# Patient Record
Sex: Male | Born: 1945 | Race: White | Hispanic: No | Smoking: Former smoker
Health system: Southern US, Community
[De-identification: ages and names within clinical notes are randomized; demographics above are authoritative.]

## PROBLEM LIST (undated history)

## (undated) DIAGNOSIS — K219 Gastro-esophageal reflux disease without esophagitis: Secondary | ICD-10-CM

## (undated) DIAGNOSIS — M5136 Other intervertebral disc degeneration, lumbar region: Secondary | ICD-10-CM

## (undated) DIAGNOSIS — F32A Depression, unspecified: Secondary | ICD-10-CM

## (undated) DIAGNOSIS — C801 Malignant (primary) neoplasm, unspecified: Secondary | ICD-10-CM

## (undated) DIAGNOSIS — F09 Unspecified mental disorder due to known physiological condition: Secondary | ICD-10-CM

## (undated) DIAGNOSIS — M51369 Other intervertebral disc degeneration, lumbar region without mention of lumbar back pain or lower extremity pain: Secondary | ICD-10-CM

## (undated) DIAGNOSIS — G809 Cerebral palsy, unspecified: Secondary | ICD-10-CM

## (undated) DIAGNOSIS — M549 Dorsalgia, unspecified: Secondary | ICD-10-CM

## (undated) DIAGNOSIS — F329 Major depressive disorder, single episode, unspecified: Secondary | ICD-10-CM

## (undated) HISTORY — PX: TOTAL HIP ARTHROPLASTY: SHX124

---

## 2015-08-12 ENCOUNTER — Emergency Department
Admission: EM | Admit: 2015-08-12 | Discharge: 2015-08-12 | Disposition: A | Discharge status: Home / Self Care | Payer: Medicare Other | Attending: Emergency Medicine | Admitting: Emergency Medicine

## 2015-08-12 ENCOUNTER — Emergency Department: Payer: Medicare Other

## 2015-08-12 DIAGNOSIS — Z87891 Personal history of nicotine dependence: Secondary | ICD-10-CM | POA: Insufficient documentation

## 2015-08-12 DIAGNOSIS — F329 Major depressive disorder, single episode, unspecified: Secondary | ICD-10-CM | POA: Diagnosis not present

## 2015-08-12 DIAGNOSIS — G8929 Other chronic pain: Secondary | ICD-10-CM | POA: Insufficient documentation

## 2015-08-12 DIAGNOSIS — G809 Cerebral palsy, unspecified: Secondary | ICD-10-CM | POA: Diagnosis not present

## 2015-08-12 DIAGNOSIS — M549 Dorsalgia, unspecified: Secondary | ICD-10-CM | POA: Insufficient documentation

## 2015-08-12 HISTORY — DX: Dorsalgia, unspecified: M54.9

## 2015-08-12 HISTORY — DX: Depression, unspecified: F32.A

## 2015-08-12 HISTORY — DX: Cerebral palsy, unspecified: G80.9

## 2015-08-12 HISTORY — DX: Major depressive disorder, single episode, unspecified: F32.9

## 2015-08-12 LAB — URINALYSIS COMPLETE WITH MICROSCOPIC (ARMC ONLY)
Bacteria, UA: NONE SEEN
Bilirubin Urine: NEGATIVE
Glucose, UA: NEGATIVE mg/dL
HGB URINE DIPSTICK: NEGATIVE
KETONES UR: NEGATIVE mg/dL
LEUKOCYTES UA: NEGATIVE
NITRITE: NEGATIVE
PH: 6 (ref 5.0–8.0)
PROTEIN: NEGATIVE mg/dL
SPECIFIC GRAVITY, URINE: 1.015 (ref 1.005–1.030)
SQUAMOUS EPITHELIAL / LPF: NONE SEEN

## 2015-08-12 MED ORDER — OXYCODONE-ACETAMINOPHEN 5-325 MG PO TABS: 2.0000 | ORAL_TABLET | Freq: Four times a day (QID) | ORAL | Status: DC | PRN

## 2015-08-12 NOTE — ED Notes (Signed)
Attempted to call report to facility.  Left message that pt would be returning to them and to call with any questions.

## 2015-08-12 NOTE — ED Notes (Signed)
Spoke with Judson Roch at [facility] and gave report.  Verbalized understanding.

## 2015-08-12 NOTE — ED Notes (Signed)
Pt has chronic back pain that pt is on medication for.  [facility] wanted patient evaluated for back pain tonight.  Pt states that pain is not any worse than normal.

## 2015-08-12 NOTE — ED Provider Notes (Signed)
[facility] Emergency Department Provider Note        Time seen: ----------------------------------------- 8:49 PM on 08/12/2015 -----------------------------------------    I have reviewed the triage vital signs and the nursing notes.   HISTORY  Chief Complaint Back Pain    HPI Bob [name] is a 70 y.o. male who presents to ER for back pain. Patient reportedly has history of chronic back pain that he is on pain medicine for. Assisted-living facility sent him for evaluation of his back pain tonight. Patient states the pain is not any worse than normal, denies any recent illness or other complaints this time.   No past medical history on file.  There are no active problems to display for this patient.   No past surgical history on file.  Allergies Review of patient's allergies indicates not on file.  Social History Social History  Substance Use Topics  . Smoking status: Not on file  . Smokeless tobacco: Not on file  . Alcohol Use: Not on file    Review of Systems Constitutional: Negative for fever. Cardiovascular: Negative for chest pain. Respiratory: Negative for shortness of breath. Gastrointestinal: Negative for abdominal pain, vomiting and diarrhea. Genitourinary: Negative for dysuria. Musculoskeletal: Positive for back pain Skin: Negative for rash. Neurological: Negative for headaches, focal weakness or numbness.  10-point ROS otherwise negative.  ____________________________________________   PHYSICAL EXAM:  VITAL SIGNS: ED Triage Vitals  Enc Vitals Group     BP --      Pulse --      Resp --      Temp 08/12/15 2048 99.4 F (37.4 C)     Temp Source 08/12/15 2048 Oral     SpO2 --      Weight --      Height --      Head Cir --      Peak Flow --      Pain Score --      Pain Loc --      Pain Edu? --      Excl. in [place]? --     Constitutional: Alert, No acute distress Eyes: Conjunctivae are normal.  Cardiovascular:  Normal rate, regular rhythm. No murmurs, rubs, or gallops. Respiratory: Normal respiratory effort without tachypnea nor retractions. Breath sounds are clear and equal bilaterally. No wheezes/rales/rhonchi. Gastrointestinal: Soft and nontender. Normal bowel sounds Musculoskeletal: Nontender with normal range of motion in all extremities. Neurologic:  Normal speech and language. Patient with chronic neurologic problems suggestive of cerebral palsy. Skin:  Skin is warm, dry and intact. No rash noted. ____________________________________________  ED COURSE:  Pertinent labs & imaging results that were available during my care of the patient were reviewed by me and considered in my medical decision making (see chart for details). Patient presents to the ER ambulatory complaining of chronic back pain. We will check basic x-rays and urinalysis. ____________________________________________    LABS (pertinent positives/negatives)  Labs Reviewed  URINALYSIS COMPLETEWITH MICROSCOPIC ([place]) - Abnormal; Notable for the following:    Color, Urine YELLOW (*)    APPearance CLEAR (*)    All other components within normal limits    RADIOLOGY Images were viewed by me  LS-spine series IMPRESSION: Multilevel degenerative change without acute abnormality. ____________________________________________  FINAL ASSESSMENT AND PLAN  Chronic back pain  Plan: Patient with labs and imaging as dictated above. Patient with chronic back pain, I will supplement his chronic back pain medication. He is stable for outpatient follow-up.   Earleen Newport,  MD   Note: This dictation was prepared with Dragon dictation. Any transcriptional errors that result from this process are unintentional   Earleen Newport, MD 08/12/15 2140

## 2015-08-12 NOTE — Discharge Instructions (Signed)
Chronic Pain  Chronic pain can be defined as pain that is off and on and lasts for 3-6 months or longer. Many things cause chronic pain, which can make it difficult to make a diagnosis. There are many treatment options available for chronic pain. However, finding a treatment that works well for you may require trying various approaches until the right one is found. Many people benefit from a combination of two or more types of treatment to control their pain.  SYMPTOMS   Chronic pain can occur anywhere in the body and can range from mild to very severe. Some types of chronic pain include:  · Headache.  · Low back pain.  · Cancer pain.  · Arthritis pain.  · Neurogenic pain. This is pain resulting from damage to nerves.   People with chronic pain may also have other symptoms such as:  · Depression.  · Anger.  · Insomnia.  · Anxiety.  DIAGNOSIS   Your health care provider will help diagnose your condition over time. In many cases, the initial focus will be on excluding possible conditions that could be causing the pain. Depending on your symptoms, your health care provider may order tests to diagnose your condition. Some of these tests may include:   · Blood tests.    · CT scan.    · MRI.    · X-rays.    · Ultrasounds.    · Nerve conduction studies.    You may need to see a specialist.   TREATMENT   Finding treatment that works well may take time. You may be referred to a pain specialist. He or she may prescribe medicine or therapies, such as:   · Mindful meditation or yoga.  · Shots (injections) of numbing or pain-relieving medicines into the spine or area of pain.  · Local electrical stimulation.  · Acupuncture.    · Massage therapy.    · Aroma, color, light, or sound therapy.    · Biofeedback.    · Working with a physical therapist to keep from getting stiff.    · Regular, gentle exercise.    · Cognitive or behavioral therapy.    · Group support.    Sometimes, surgery may be recommended.   HOME CARE INSTRUCTIONS    · Take all medicines as directed by your health care provider.    · Lessen stress in your life by relaxing and doing things such as listening to calming music.    · Exercise or be active as directed by your health care provider.    · Eat a healthy diet and include things such as vegetables, fruits, fish, and lean meats in your diet.    · Keep all follow-up appointments with your health care provider.    · Attend a support group with others suffering from chronic pain.  SEEK MEDICAL CARE IF:   · Your pain gets worse.    · You develop a new pain that was not there before.    · You cannot tolerate medicines given to you by your health care provider.    · You have new symptoms since your last visit with your health care provider.    SEEK IMMEDIATE MEDICAL CARE IF:   · You feel weak.    · You have decreased sensation or numbness.    · You lose control of bowel or bladder function.    · Your pain suddenly gets much worse.    · You develop shaking.  · You develop chills.  · You develop confusion.  · You develop chest pain.  · You develop shortness of breath.    MAKE SURE YOU:  ·   Document Revised: 10/26/2012 Document Reviewed: 08/19/2012 Elsevier Interactive Patient Education 2016 Elsevier Inc.  Chronic Back Pain  When back pain lasts longer than 3 months, it is called chronic back pain.People with chronic back pain often go through certain periods that are more intense (flare-ups).  CAUSES Chronic back pain can be caused by wear and tear (degeneration) on different structures in your back. These structures include:  The bones of your spine (vertebrae) and the joints surrounding  your spinal cord and nerve roots (facets).  The strong, fibrous tissues that connect your vertebrae (ligaments). Degeneration of these structures may result in pressure on your nerves. This can lead to constant pain. HOME CARE INSTRUCTIONS  Avoid bending, heavy lifting, prolonged sitting, and activities which make the problem worse.  Take brief periods of rest throughout the day to reduce your pain. Lying down or standing usually is better than sitting while you are resting.  Take over-the-counter or prescription medicines only as directed by your caregiver. SEEK IMMEDIATE MEDICAL CARE IF:   You have weakness or numbness in one of your legs or feet.  You have trouble controlling your bladder or bowels.  You have nausea, vomiting, abdominal pain, shortness of breath, or fainting.   This information is not intended to replace advice given to you by your health care provider. Make sure you discuss any questions you have with your health care provider.   Document Released: 04/02/2004 Document Revised: 05/18/2011 Document Reviewed: 08/13/2014 Elsevier Interactive Patient Education [employer].

## 2015-12-02 ENCOUNTER — Emergency Department
Admission: EM | Admit: 2015-12-02 | Discharge: 2015-12-02 | Disposition: A | Discharge status: Home / Self Care | Payer: Medicare Other | Attending: Emergency Medicine | Admitting: Emergency Medicine

## 2015-12-02 ENCOUNTER — Encounter: Payer: Self-pay | Admitting: Emergency Medicine

## 2015-12-02 ENCOUNTER — Emergency Department: Payer: Medicare Other

## 2015-12-02 DIAGNOSIS — G8929 Other chronic pain: Secondary | ICD-10-CM | POA: Diagnosis not present

## 2015-12-02 DIAGNOSIS — M542 Cervicalgia: Secondary | ICD-10-CM | POA: Insufficient documentation

## 2015-12-02 DIAGNOSIS — M549 Dorsalgia, unspecified: Secondary | ICD-10-CM | POA: Diagnosis not present

## 2015-12-02 DIAGNOSIS — Z87891 Personal history of nicotine dependence: Secondary | ICD-10-CM | POA: Insufficient documentation

## 2015-12-02 MED ORDER — KETOROLAC TROMETHAMINE 30 MG/ML IJ SOLN
30.0000 mg | Freq: Once | INTRAMUSCULAR | Status: AC
  Administered 2015-12-02: 30 mg via INTRAMUSCULAR
  Filled 2015-12-02: qty 1

## 2015-12-02 NOTE — ED Provider Notes (Signed)
[facility] Emergency Department Provider Note   ____________________________________________    I have reviewed the triage vital signs and the nursing notes.   HISTORY  Chief Complaint Neck Pain   Patient with a history of cerebral palsy, he does have difficulty verbalizing some things.  HPI Bob [name] is a 70 y.o. male Who presents with complaints of neck pain.patient has a history of chronic back pain but reports he developed neck pain today. He reports the pain is in the middle of his neck and does not remember injury to the area. He denies fevers or chills. No nausea or Vomiting. He denies  New Weakness or numbness   Past Medical History:  Diagnosis Date  . Back pain   . Cerebral palsy ([place])   . Depression     There are no active problems to display for this patient.   History reviewed. No pertinent surgical history.  Prior to Admission medications   Medication Sig Start Date End Date Taking? Authorizing Provider  oxyCODONE-acetaminophen (PERCOCET) 5-325 MG tablet Take 2 tablets by mouth every 6 (six) hours as needed for moderate pain or severe pain. 08/12/15   Earleen Newport, MD     Allergies Review of patient's allergies indicates no known allergies.  History reviewed. No pertinent family history.  Social History Social History  Substance Use Topics  . Smoking status: Former Research scientist (life sciences)  . Smokeless tobacco: Never Used  . Alcohol use Not on file    Review of Systems  Constitutional: No fever Eyes: No visual changes.  ENT: back pain as above Cardiovascular: Denies chest pain. Respiratory: Denies shortness of breath. Gastrointestinal:  No nausea, no vomiting.    Musculoskeletal: chronic back pain back pain. Skin: Negative for rash. Neurological: Negative for headaches or weakness  10-point ROS otherwise negative.  ____________________________________________   PHYSICAL EXAM:  VITAL SIGNS: ED Triage Vitals  Enc  Vitals Group     BP 12/02/15 2040 (!) 146/87     Pulse Rate 12/02/15 2040 (!) 58     Resp 12/02/15 2040 16     Temp 12/02/15 2040 98.3 F (36.8 C)     Temp Source 12/02/15 2040 Oral     SpO2 12/02/15 2040 91 %     Weight --      Height --      Head Circumference --      Peak Flow --      Pain Score 12/02/15 2037 8     Pain Loc --      Pain Edu? --      Excl. in [place]? --     Constitutional: Alert and oriented. No acute distress. Pleasant and interactive Eyes: Conjunctivae are normal.  Head: Atraumatic. Nose: No congestion/rhinnorhea. Mouth/Throat: Mucous membranes are moist.   Neck:  No vertebral tenderness to palpation, patient reports pain when he rotates his head to the left or to the right Cardiovascular: Normal rate, regular rhythm. Grossly normal heart sounds.  Good peripheral circulation. Respiratory: Normal respiratory effort.  No retractions.    Musculoskeletal: .  Warm and well perfused Neurologic: some difficulty with speech but we are able to converse Skin:  Skin is warm, dry and intact. No rash noted. Psychiatric: Mood and affect are normal. behavior are normal.  ____________________________________________   LABS (all labs ordered are listed, but only abnormal results are displayed)  Labs Reviewed - No data to display ____________________________________________  EKG  None ____________________________________________  RADIOLOGY  CT scan does not  demonstrate any acute abnormalities ____________________________________________   PROCEDURES  Procedure(s) performed: No    Critical Care performed:No ____________________________________________   INITIAL IMPRESSION / ASSESSMENT AND PLAN / ED COURSE  Pertinent labs & imaging results that were available during my care of the patient were reviewed by me and considered in my medical decision making (see chart for details).  Patient presents with complaints of neck pain. He is afebrile, no rash, not  consistent with meningitis given relatively abrupt onset today. Overall he is well-appearing and nontoxic. Suspect musculoskeletal pain we will give a dose of Toradol and obtain imaging  Clinical Course  CT scan demonstrates significant degenerative disease but no acute abnormalities. I discussed this with the patient, he will continue his usual pain medication and follow up with his PCP ____________________________________________   FINAL CLINICAL IMPRESSION(S) / ED DIAGNOSES  Final diagnoses:  Neck pain      NEW MEDICATIONS STARTED DURING THIS VISIT:  New Prescriptions   No medications on file     Note:  This document was prepared using Dragon voice recognition software and may include unintentional dictation errors.    Lavonia Drafts, MD 12/02/15 2202

## 2015-12-02 NOTE — ED Triage Notes (Signed)
Pt present to ED via EMS with c/o neck pain. States difficult to move.

## 2016-02-05 ENCOUNTER — Ambulatory Visit
Admission: EM | Admit: 2016-02-05 | Discharge: 2016-02-05 | Discharge status: Absent without leave | Payer: Medicare Other

## 2016-04-08 ENCOUNTER — Emergency Department
Admission: EM | Admit: 2016-04-08 | Discharge: 2016-04-08 | Disposition: A | Discharge status: Home / Self Care | Payer: Medicare Other | Attending: Emergency Medicine | Admitting: Emergency Medicine

## 2016-04-08 DIAGNOSIS — Z87891 Personal history of nicotine dependence: Secondary | ICD-10-CM | POA: Insufficient documentation

## 2016-04-08 DIAGNOSIS — J029 Acute pharyngitis, unspecified: Secondary | ICD-10-CM

## 2016-04-08 DIAGNOSIS — Z79899 Other long term (current) drug therapy: Secondary | ICD-10-CM | POA: Diagnosis not present

## 2016-04-08 LAB — INFLUENZA PANEL BY PCR (TYPE A & B)
INFLAPCR: NEGATIVE
INFLBPCR: NEGATIVE

## 2016-04-08 LAB — POCT RAPID STREP A: Streptococcus, Group A Screen (Direct): NEGATIVE

## 2016-04-08 MED ORDER — AMOXICILLIN 875 MG PO TABS: 875.0000 mg | ORAL_TABLET | Freq: Two times a day (BID) | ORAL | 0 refills | Status: AC

## 2016-04-08 MED ORDER — LIDOCAINE VISCOUS 2 % MT SOLN
15.0000 mL | Freq: Once | OROMUCOSAL | Status: AC
  Administered 2016-04-08: 15 mL via OROMUCOSAL
  Filled 2016-04-08: qty 15

## 2016-04-08 NOTE — ED Triage Notes (Signed)
Per EMS, pt from [facility] assisted, pt having sore throat x 3 days with no other complaints.  Pt states that it hurts to talk.  Pt in NAD.

## 2016-04-08 NOTE — ED Notes (Signed)
Report to [facility] at [facility]

## 2016-04-08 NOTE — ED Provider Notes (Signed)
[facility] Emergency Department Provider Note    First MD Initiated Contact with Patient 04/08/16 0533     (approximate)  I have reviewed the triage vital signs and the nursing notes.   HISTORY  Chief Complaint Sore Throat   HPI Bob [name] is a 71 y.o. male with bolus of chronic medical conditions presents to the emergency department with sore throat 3 days. Patient denies any fever afebrile on presentation   Past Medical History:  Diagnosis Date  . Back pain   . Cerebral palsy ([place])   . Depression     There are no active problems to display for this patient.  Past surgical history No pertinent past surgical history  Prior to Admission medications   Medication Sig Start Date End Date Taking? Authorizing Provider  oxyCODONE-acetaminophen (PERCOCET) 5-325 MG tablet Take 2 tablets by mouth every 6 (six) hours as needed for moderate pain or severe pain. 08/12/15   Earleen Newport, MD    Allergies Patient has no known allergies.  No family history on file.  Social History Social History  Substance Use Topics  . Smoking status: Former Research scientist (life sciences)  . Smokeless tobacco: Never Used  . Alcohol use Not on file    Review of Systems Constitutional: No fever/chills Eyes: No visual changes. [id number] for sore throat. Cardiovascular: Denies chest pain. Respiratory: Denies shortness of breath. Gastrointestinal: No abdominal pain.  No nausea, no vomiting.  No diarrhea.  No constipation. Genitourinary: Negative for dysuria. Musculoskeletal: Negative for back pain. Skin: Negative for rash. Neurological: Negative for headaches, focal weakness or numbness.  10-point ROS otherwise negative.  ____________________________________________   PHYSICAL EXAM:  VITAL SIGNS: ED Triage Vitals [04/08/16 0537]  Enc Vitals Group     BP      Pulse      Resp      Temp 97.9 F (36.6 C)     Temp Source Oral     SpO2      Weight 250 lb (113.4 kg)    Height 5\' 11"  (1.803 m)     Head Circumference      Peak Flow      Pain Score      Pain Loc      Pain Edu?      Excl. in [place]?     Constitutional: Alert and oriented. Well appearing and in no acute distress. Eyes: Conjunctivae are normal. PERRL. EOMI. Head: Atraumatic. Ears:  Healthy appearing ear canals and TMs bilaterally Nose: No congestion/rhinnorhea. Mouth/Throat: Mucous membranes are moist.  Pharyngeal erythema  Neck: No stridor. Cardiovascular: Normal rate, regular rhythm. Good peripheral circulation. Grossly normal heart sounds. Respiratory: Normal respiratory effort.  No retractions. Lungs CTAB. Gastrointestinal: Soft and nontender. No distention.  Musculoskeletal: No lower extremity tenderness nor edema. No gross deformities of extremities. Neurologic:  Normal speech and language. No gross focal neurologic deficits are appreciated.  Skin:  Skin is warm, dry and intact. No rash noted. Psychiatric: Mood and affect are normal. Speech and behavior are normal.  ____________________________________________   LABS (all labs ordered are listed, but only abnormal results are displayed)  Labs Reviewed  INFLUENZA PANEL BY PCR (TYPE A & B)       Procedures    INITIAL IMPRESSION / ASSESSMENT AND PLAN / ED COURSE  Pertinent labs & imaging results that were available during my care of the patient were reviewed by me and considered in my medical decision making (see chart for details).  ____________________________________________  FINAL CLINICAL IMPRESSION(S) / ED DIAGNOSES  Final diagnoses:  Pharyngitis, unspecified etiology     MEDICATIONS GIVEN DURING THIS VISIT:  Medications - No data to display   NEW OUTPATIENT MEDICATIONS STARTED DURING THIS VISIT:  New Prescriptions   No medications on file    Modified Medications   No medications on file    Discontinued Medications   No medications on file     Note:  This document was prepared  using Dragon voice recognition software and may include unintentional dictation errors.    Gregor Hams, MD 04/08/16 [phone]

## 2016-04-08 NOTE — ED Notes (Signed)
Pt lying on stretcher watching tv, denies complaints, sipping water.

## 2016-11-24 ENCOUNTER — Emergency Department
Admission: EM | Admit: 2016-11-24 | Discharge: 2016-11-24 | Disposition: A | Discharge status: Home / Self Care | Payer: Medicare Other | Attending: Emergency Medicine | Admitting: Emergency Medicine

## 2016-11-24 ENCOUNTER — Encounter: Payer: Self-pay | Admitting: Emergency Medicine

## 2016-11-24 DIAGNOSIS — R531 Weakness: Secondary | ICD-10-CM

## 2016-11-24 DIAGNOSIS — G809 Cerebral palsy, unspecified: Secondary | ICD-10-CM | POA: Insufficient documentation

## 2016-11-24 DIAGNOSIS — R5381 Other malaise: Secondary | ICD-10-CM | POA: Diagnosis present

## 2016-11-24 DIAGNOSIS — Y69 Unspecified misadventure during surgical and medical care: Secondary | ICD-10-CM | POA: Diagnosis not present

## 2016-11-24 DIAGNOSIS — Z87891 Personal history of nicotine dependence: Secondary | ICD-10-CM | POA: Insufficient documentation

## 2016-11-24 DIAGNOSIS — T887XXA Unspecified adverse effect of drug or medicament, initial encounter: Secondary | ICD-10-CM | POA: Diagnosis not present

## 2016-11-24 DIAGNOSIS — T50905A Adverse effect of unspecified drugs, medicaments and biological substances, initial encounter: Secondary | ICD-10-CM

## 2016-11-24 LAB — CBC WITH DIFFERENTIAL/PLATELET
Basophils Absolute: 0.1 10*3/uL (ref 0–0.1)
Basophils Relative: 1 %
EOS ABS: 0.4 10*3/uL (ref 0–0.7)
Eosinophils Relative: 5 %
HCT: 38.1 % — ABNORMAL LOW (ref 40.0–52.0)
Hemoglobin: 12.9 g/dL — ABNORMAL LOW (ref 13.0–18.0)
LYMPHS ABS: 1.7 10*3/uL (ref 1.0–3.6)
LYMPHS PCT: 22 %
MCH: 31.6 pg (ref 26.0–34.0)
MCHC: 33.9 g/dL (ref 32.0–36.0)
MCV: 93.2 fL (ref 80.0–100.0)
MONO ABS: 0.5 10*3/uL (ref 0.2–1.0)
Monocytes Relative: 7 %
Neutro Abs: 5 10*3/uL (ref 1.4–6.5)
Neutrophils Relative %: 65 %
PLATELETS: 239 10*3/uL (ref 150–440)
RBC: 4.09 MIL/uL — ABNORMAL LOW (ref 4.40–5.90)
RDW: 14.7 % — ABNORMAL HIGH (ref 11.5–14.5)
WBC: 7.8 10*3/uL (ref 3.8–10.6)

## 2016-11-24 LAB — COMPREHENSIVE METABOLIC PANEL
ALBUMIN: 3.9 g/dL (ref 3.5–5.0)
ALK PHOS: 87 U/L (ref 38–126)
ALT: 14 U/L — AB (ref 17–63)
AST: 29 U/L (ref 15–41)
Anion gap: 8 (ref 5–15)
BUN: 16 mg/dL (ref 6–20)
CALCIUM: 9.3 mg/dL (ref 8.9–10.3)
CHLORIDE: 105 mmol/L (ref 101–111)
CO2: 26 mmol/L (ref 22–32)
CREATININE: 0.88 mg/dL (ref 0.61–1.24)
GFR calc Af Amer: >60 mL/min (ref >60)
GFR calc non Af Amer: >60 mL/min (ref >60)
GLUCOSE: 117 mg/dL — AB (ref 65–99)
Potassium: 4.2 mmol/L (ref 3.5–5.1)
SODIUM: 139 mmol/L (ref 135–145)
Total Bilirubin: 0.9 mg/dL (ref 0.3–1.2)
Total Protein: 6.7 g/dL (ref 6.5–8.1)

## 2016-11-24 LAB — URINALYSIS, COMPLETE (UACMP) WITH MICROSCOPIC
BACTERIA UA: NONE SEEN
Bilirubin Urine: NEGATIVE
GLUCOSE, UA: NEGATIVE mg/dL
HGB URINE DIPSTICK: NEGATIVE
KETONES UR: NEGATIVE mg/dL
Leukocytes, UA: NEGATIVE
NITRITE: NEGATIVE
PROTEIN: NEGATIVE mg/dL
Specific Gravity, Urine: 1.012 (ref 1.005–1.030)
pH: 6 (ref 5.0–8.0)

## 2016-11-24 LAB — TROPONIN I: Troponin I: <0.03 ng/mL (ref <0.03)

## 2016-11-24 MED ORDER — OXYCODONE HCL 5 MG PO TABS
5.0000 mg | ORAL_TABLET | Freq: Once | ORAL | Status: AC
  Administered 2016-11-24: 5 mg via ORAL
  Filled 2016-11-24: qty 1

## 2016-11-24 MED ORDER — MORPHINE SULFATE ER 15 MG PO TBCR: 15.0000 mg | EXTENDED_RELEASE_TABLET | Freq: Two times a day (BID) | ORAL | 0 refills | Status: DC

## 2016-11-24 MED ORDER — PREDNISONE 10 MG PO TABS
50.0000 mg | ORAL_TABLET | Freq: Once | ORAL | Status: AC
  Administered 2016-11-24: 16:00:00 50 mg via ORAL
  Filled 2016-11-24: qty 2

## 2016-11-24 NOTE — ED Provider Notes (Signed)
[facility] Emergency Department Provider Note       Time seen: ----------------------------------------- 1:53 PM on 11/24/2016 -----------------------------------------     I have reviewed the triage vital signs and the nursing notes.   HISTORY   Chief Complaint No chief complaint on file.    HPI Bob [name] is a 71 y.o. male who presents to the ED for weakness and general ill feeling. Patient reports "I just don't feel good". Patient denies recent illness is only complaining of chronic arthritis pain in his arms. He arrives alert and oriented but again just describes diffuse weakness and pain. He denies fevers, chills, vomiting or diarrhea. Patient denies changes in his medicines.   Past Medical History:  Diagnosis Date  . Back pain   . Cerebral palsy ([place])   . Depression     There are no active problems to display for this patient.   No past surgical history on file.  Allergies Patient has no known allergies.  Social History Social History  Substance Use Topics  . Smoking status: Former Research scientist (life sciences)  . Smokeless tobacco: Never Used  . Alcohol use Not on file    Review of Systems Constitutional: Negative for fever. Cardiovascular: Negative for chest pain. Respiratory: Negative for shortness of breath. Gastrointestinal: Negative for abdominal pain, vomiting and diarrhea. Genitourinary: Negative for dysuria. Musculoskeletal: positive for arm and joint pain Skin: Negative for rash. Neurological: Negative for headaches, positive for weakness  All systems negative/normal/unremarkable except as stated in the HPI  ____________________________________________   PHYSICAL EXAM:  VITAL SIGNS: ED Triage Vitals  Enc Vitals Group     BP      Pulse      Resp      Temp      Temp src      SpO2      Weight      Height      Head Circumference      Peak Flow      Pain Score      Pain Loc      Pain Edu?      Excl. in [place]?      Constitutional: Alert and oriented. Wno distress Eyes: Conjunctivae are normal. Normal extraocular movements. ENT   Head: Normocephalic and atraumatic.   Nose: No congestion/rhinnorhea.   Mouth/Throat: Mucous membranes are moist.   Neck: No stridor. Cardiovascular: Normal rate, regular rhythm. No murmurs, rubs, or gallops. Respiratory: Normal respiratory effort without tachypnea nor retractions. Breath sounds are clear and equal bilaterally. No wheezes/rales/rhonchi. Gastrointestinal: Soft and nontender. Normal bowel sounds Musculoskeletal: Nontender with normal range of motion in extremities. No lower extremity tenderness nor edema. Neurologic:  No gross focal neurologic deficits are appreciated. patient has a chronic dysarthria Skin:  Skin is warm, dry and intact. No rash noted. Psychiatric: depressed mood ____________________________________________  EKG: Interpreted by me.sinus rhythm rate of 51 bpm, normal PR interval, wide QRS, normal QT, nonspecific ST-T segment changes  ____________________________________________  ED COURSE:  Pertinent labs & imaging results that were available during my care of the patient were reviewed by me and considered in my medical decision making (see chart for details). Patient presents for weakness, we will assess with labs and imaging as indicated. Clinical Course as of Nov 24 1845  Tue Nov 24, 2016  1617 etiology for his fatigue has likely been related to recent medication changes. He is taking significant doses of narcotics compared to his prior prescriptions.  [JW]    Clinical Course User  Index [JW] Earleen Newport, MD   Procedures ____________________________________________   LABS (pertinent positives/negatives)  Labs Reviewed  CBC WITH DIFFERENTIAL/PLATELET - Abnormal; Notable for the following:       Result Value   RBC 4.09 (*)    Hemoglobin 12.9 (*)    HCT 38.1 (*)    RDW 14.7 (*)    All other components  within normal limits  COMPREHENSIVE METABOLIC PANEL - Abnormal; Notable for the following:    Glucose, Bld 117 (*)    ALT 14 (*)    All other components within normal limits  URINALYSIS, COMPLETE (UACMP) WITH MICROSCOPIC - Abnormal; Notable for the following:    Color, Urine YELLOW (*)    APPearance CLEAR (*)    Squamous Epithelial / LPF 0-5 (*)    All other components within normal limits  TROPONIN I   ___________________________________________  FINAL ASSESSMENT AND PLAN  weakness, adverse medication reaction   Plan: Patient's labs and imaging were dictated above. Patient had presented for weakness which seems to be related to medications that he had changed recently. Patient was taking only Vicodin for pain but now is on Percocet for breakthrough pain and 30 mg of extended release morphine. I will decrease his morphine down to 15 mg of MS Contin. He is stable for outpatient follow-up.   Earleen Newport, MD   Note: This note was generated in part or whole with voice recognition software. Voice recognition is usually quite accurate but there are transcription errors that can and very often do occur. I apologize for any typographical errors that were not detected and corrected.     Earleen Newport, MD 11/24/16 [id number]

## 2016-11-24 NOTE — ED Notes (Signed)
EMS here to transport pt home.  

## 2016-11-24 NOTE — ED Notes (Signed)
[facility] called for transport to [facility] °

## 2016-11-24 NOTE — ED Triage Notes (Signed)
Patient from [facility] via [place]. Reports general malaise and states "I just don't feel good". Patient denies recent illness and only complaining of chronic arthritis pain in arms. Patient with history of arthritis and CP. Patient alert and oriented x4.

## 2016-12-03 ENCOUNTER — Ambulatory Visit
Admission: EM | Admit: 2016-12-03 | Discharge: 2016-12-03 | Disposition: A | Discharge status: Home / Self Care | Payer: Medicare Other | Attending: Family Medicine | Admitting: Family Medicine

## 2016-12-03 DIAGNOSIS — H05011 Cellulitis of right orbit: Secondary | ICD-10-CM | POA: Diagnosis not present

## 2016-12-03 DIAGNOSIS — H02843 Edema of right eye, unspecified eyelid: Secondary | ICD-10-CM

## 2016-12-03 HISTORY — DX: Gastro-esophageal reflux disease without esophagitis: K21.9

## 2016-12-03 HISTORY — DX: Other intervertebral disc degeneration, lumbar region: M51.36

## 2016-12-03 HISTORY — DX: Other intervertebral disc degeneration, lumbar region without mention of lumbar back pain or lower extremity pain: M51.369

## 2016-12-03 MED ORDER — CEFUROXIME AXETIL 500 MG PO TABS: 500.0000 mg | ORAL_TABLET | Freq: Two times a day (BID) | ORAL | 0 refills | Status: DC

## 2016-12-03 MED ORDER — CEFTRIAXONE SODIUM 1 G IJ SOLR
1.0000 g | Freq: Once | INTRAMUSCULAR | Status: AC
  Administered 2016-12-03: 1 g via INTRAMUSCULAR

## 2016-12-03 NOTE — ED Triage Notes (Signed)
Right eyelid very swollen and slightly reddened. Awoke like this this a.m. At the facility. Reports it feels like "pressure"

## 2016-12-03 NOTE — ED Provider Notes (Signed)
[facility]    CSN: [id number] Arrival date & time: 12/03/16  0941     History   Chief Complaint Chief Complaint  Patient presents with  . Eye Problem    HPI Bob [name] is a 71 y.o. male.   Unfortunately unable to get much information. Patient has significant several palsy. He does state that my hands feel cool and still nice when I was examining him. Apparently everything started this morning with a marked swelling of the right eye. There is no fever recorded in to get much history from the patient consists of a palsy. Medical record that they brought red look at he has history of cerebral palsy junction disc disease depression and GERD and back pain.   The history is provided by medical records (Patient has a Medication problems due to his cerebral palsy unfortunate caregiver that came with him some longer here). The history is limited by the absence of a caregiver. No language interpreter was used.  Eye Problem  Location:  Right eye Quality: Swollen upper and lower right eyelid. Severity:  Severe Onset quality:  Sudden Timing:  Constant Progression:  Worsening Chronicity:  New Relieved by:  Nothing Worsened by:  Nothing Ineffective treatments:  None tried Associated symptoms: inflammation, redness and swelling   Risk factors: no conjunctival hemorrhage, no previous injury to eye and no recent URI     Past Medical History:  Diagnosis Date  . Back pain   . Cerebral palsy ([place])   . DDD (degenerative disc disease), lumbar   . Depression   . Depression   . GERD (gastroesophageal reflux disease)     There are no active problems to display for this patient.   Past Surgical History:  Procedure Laterality Date  . TOTAL HIP ARTHROPLASTY         Home Medications    Prior to Admission medications   Medication Sig Start Date End Date Taking? Authorizing Provider  acetaminophen (TYLENOL) 325 MG tablet Take 650 mg by mouth every 6 (six) hours as needed  for mild pain or fever.    [provider]  acetaminophen (TYLENOL) 500 MG tablet Take 500 mg by mouth 2 (two) times daily.    [provider]  ARIPiprazole (ABILIFY) 2 MG tablet Take 2 mg by mouth every Monday, Wednesday, and Friday.    [provider]  aspirin EC 81 MG tablet Take 81 mg by mouth daily.    [provider]  bisacodyl (DULCOLAX) 5 MG EC tablet Take 5 mg by mouth at bedtime.    [provider]  cefUROXime (CEFTIN) 500 MG tablet Take 1 tablet (500 mg total) by mouth 2 (two) times daily. 12/03/16   Frederich Cha, MD  cefUROXime (CEFTIN) 500 MG tablet Take 1 tablet (500 mg total) by mouth 2 (two) times daily. 12/03/16   Frederich Cha, MD  cetirizine (ZYRTEC) 10 MG tablet Take 10 mg by mouth daily.    [provider]  Cholecalciferol (D3-1000 PO) Take 1,000 Units by mouth daily.    [provider]  docusate sodium (COLACE) 100 MG capsule Take 200 mg by mouth daily.    [provider]  DULoxetine (CYMBALTA) 60 MG capsule Take 60 mg by mouth daily.    [provider]  gabapentin (NEURONTIN) 600 MG tablet Take 600 mg by mouth at bedtime.    [provider]  levothyroxine (SYNTHROID, LEVOTHROID) 100 MCG tablet Take 100 mcg by mouth daily before breakfast.  [provider]  lithium carbonate 300 MG capsule Take 300 mg by mouth 2 (two) times daily with a meal.    [provider]  Melatonin 3 MG TABS Take 3 mg by mouth at bedtime.    [provider]  memantine (NAMENDA XR) 28 MG CP24 24 hr capsule Take 28 mg by mouth daily.    [provider]  Menthol (ICY HOT BACK) 5 % PTCH Apply 1 patch topically daily.    [provider]  modafinil (PROVIGIL) 200 MG tablet Take 200 mg by mouth every Sunday.    [provider]  morphine (MS CONTIN) 15 MG 12 hr tablet Take 1 tablet (15 mg total) by mouth every 12 (twelve) hours. 11/24/16   Earleen Newport, MD    Multiple Vitamins-Minerals (CERTAVITE SENIOR/ANTIOXIDANT PO) Take 1 tablet by mouth daily.    [provider]  Omega-3 Fatty Acids (FISH OIL) 1000 MG CAPS Take 1,000 mg by mouth daily.    [provider]  oxyCODONE-acetaminophen (PERCOCET) 5-325 MG tablet Take 2 tablets by mouth every 6 (six) hours as needed for moderate pain or severe pain. Patient taking differently: Take 1 tablet by mouth every 4 (four) hours as needed (breakthrough pain).  08/12/15   Earleen Newport, MD  polyethylene glycol (MIRALAX / Floria Raveling) packet Take 17 g by mouth daily.    [provider]  simvastatin (ZOCOR) 20 MG tablet Take 20 mg by mouth at bedtime.    [provider]  tamsulosin (FLOMAX) 0.4 MG CAPS capsule Take 0.4 mg by mouth daily after supper.    [provider]    Family History History reviewed. No pertinent family history.  Social History Social History  Substance Use Topics  . Smoking status: Former Research scientist (life sciences)  . Smokeless tobacco: Never Used  . Alcohol use No     Allergies   Patient has no known allergies.   Review of Systems Review of Systems  Eyes: Positive for pain and redness.  All other systems reviewed and are negative.    Physical Exam Triage Vital Signs ED Triage Vitals  Enc Vitals Group     BP 12/03/16 1011 (!) 145/72     Pulse Rate 12/03/16 1011 (!) 58     Resp 12/03/16 1011 18     Temp 12/03/16 1011 97.8 F (36.6 C)     Temp Source 12/03/16 1011 Oral     SpO2 12/03/16 1011 95 %     Weight 12/03/16 1012 183 lb (83 kg)     Height 12/03/16 1012 5\' 5"  (1.651 m)     Head Circumference --      Peak Flow --      Pain Score 12/03/16 1012 2     Pain Loc --      Pain Edu? --      Excl. in [place]? --    No data found.   Updated Vital Signs BP (!) 145/72 (BP Location: Left Arm)   Pulse (!) 58   Temp 97.8 F (36.6 C) (Oral)   Resp 18   Ht 5\' 5"  (1.651 m)   Wt 183 lb (83 kg)   SpO2 95%   BMI 30.45 kg/m   Visual  Acuity Right Eye Distance:   Left Eye Distance:   Bilateral Distance:    Right Eye Near:   Left Eye Near:    Bilateral Near:     Physical Exam  Constitutional: He appears well-developed and well-nourished.  HENT:  Head: Normocephalic and atraumatic.  Right Ear: External ear normal.  Left Ear: External ear normal.  Mouth/Throat: Oropharynx is clear and moist.  Eyes: Pupils are equal, round, and reactive to light. EOM are normal. Right conjunctiva is injected. Left conjunctiva is injected.    Patient with marked swelling of both the right and eyelid upper and lower is a little bit of redness along the conjunctiva most this time that that looks like is full of fluid  Neck: Normal range of motion. Neck supple.  Pulmonary/Chest: Effort normal.  Lymphadenopathy:    He has no cervical adenopathy.  Skin: Skin is warm.  Psychiatric: He has a normal mood and affect.  Vitals reviewed.    UC Treatments / Results  Labs (all labs ordered are listed, but only abnormal results are displayed) Labs Reviewed - No data to display  EKG  EKG Interpretation None       Radiology No results found.  Procedures Procedures (including critical care time)  Medications Ordered in UC Medications  cefTRIAXone (ROCEPHIN) injection 1 g (1 g Intramuscular Given 12/03/16 1206)     Initial Impression / Assessment and Plan / UC Course  I have reviewed the triage vital signs and the nursing notes.  Pertinent labs & imaging results that were available during my care of the patient were reviewed by me and considered in my medical decision making (see chart for details).  Patient denies being bit injury or any type of staining of the right eye. And according to him and the caregiver who brought him everything started this morning undergoing treatment for orbital cellulitis given a gram of Rocephin here Ceftin 500 twice a day but stress that if he is not better by tomorrow we need to go to the  emergency room if is better still recommend follow-up with his PCP in 1-2 weeks.       Final Clinical Impressions(s) / UC Diagnoses   Final diagnoses:  Cellulitis of right orbital region  Swollen eyelid, right    New Prescriptions New Prescriptions   CEFUROXIME (CEFTIN) 500 MG TABLET    Take 1 tablet (500 mg total) by mouth 2 (two) times daily.   CEFUROXIME (CEFTIN) 500 MG TABLET    Take 1 tablet (500 mg total) by mouth 2 (two) times daily.     Controlled Substance Prescriptions [place] Controlled Substance Registry consulted? Not Applicable   Frederich Cha, MD 12/03/16 1232

## 2017-06-20 ENCOUNTER — Emergency Department: Payer: Medicare Other

## 2017-06-20 ENCOUNTER — Emergency Department
Admission: EM | Admit: 2017-06-20 | Discharge: 2017-06-20 | Disposition: A | Discharge status: Home / Self Care | Payer: Medicare Other | Attending: Emergency Medicine | Admitting: Emergency Medicine

## 2017-06-20 ENCOUNTER — Other Ambulatory Visit: Payer: Self-pay

## 2017-06-20 DIAGNOSIS — Y92129 Unspecified place in nursing home as the place of occurrence of the external cause: Secondary | ICD-10-CM | POA: Insufficient documentation

## 2017-06-20 DIAGNOSIS — S0990XA Unspecified injury of head, initial encounter: Secondary | ICD-10-CM | POA: Diagnosis not present

## 2017-06-20 DIAGNOSIS — Y998 Other external cause status: Secondary | ICD-10-CM | POA: Insufficient documentation

## 2017-06-20 DIAGNOSIS — Z23 Encounter for immunization: Secondary | ICD-10-CM | POA: Insufficient documentation

## 2017-06-20 DIAGNOSIS — Y9389 Activity, other specified: Secondary | ICD-10-CM | POA: Insufficient documentation

## 2017-06-20 DIAGNOSIS — W0110XA Fall on same level from slipping, tripping and stumbling with subsequent striking against unspecified object, initial encounter: Secondary | ICD-10-CM | POA: Diagnosis not present

## 2017-06-20 MED ORDER — TETANUS-DIPHTH-ACELL PERTUSSIS 5-2.5-18.5 LF-MCG/0.5 IM SUSP
0.5000 mL | Freq: Once | INTRAMUSCULAR | Status: AC
  Administered 2017-06-20: 0.5 mL via INTRAMUSCULAR

## 2017-06-20 MED ORDER — TETANUS-DIPHTH-ACELL PERTUSSIS 5-2.5-18.5 LF-MCG/0.5 IM SUSP
INTRAMUSCULAR | Status: AC
  Filled 2017-06-20: qty 0.5

## 2017-06-20 NOTE — ED Notes (Signed)
Report to [place] [facility], no transportation available back to facility by facility per staff at [facility].

## 2017-06-20 NOTE — ED Notes (Signed)
Cleansed pt's face, after removing all dried blood, no laceration visible, pt with abrasion noted to left lateral eyebrow with controlled bleeding.

## 2017-06-20 NOTE — ED Notes (Signed)
Pt to ct 

## 2017-06-20 NOTE — ED Triage Notes (Signed)
Pt from [facility], witnessed fall. Pt caught toes on carpet, falling, striking left head and left elbow. Pt complains of facial pain to left upper face and left elbow. Pt with laceration with controlled bleeding to left lateral eyebrow.

## 2017-06-20 NOTE — ED Provider Notes (Signed)
[facility] Emergency Department Provider Note   ____________________________________________    I have reviewed the triage vital signs and the nursing notes.   HISTORY  Chief Complaint Fall and Facial Laceration     HPI Bob [name] is a 72 y.o. male with a history of cerebral palsy who presents after a fall.  Patient reports he caught his toe on a carpet and fell forward and struck his head.  Denies LOC.  Suffered a laceration above the left eye.  No neck pain.  No chest pain abdominal pain back pain or extremity injuries.  Does not know when his last tetanus shot was.   Past Medical History:  Diagnosis Date  . Back pain   . Cerebral palsy ([place])   . DDD (degenerative disc disease), lumbar   . Depression   . Depression   . GERD (gastroesophageal reflux disease)     There are no active problems to display for this patient.   Past Surgical History:  Procedure Laterality Date  . TOTAL HIP ARTHROPLASTY      Prior to Admission medications   Medication Sig Start Date End Date Taking? Authorizing Provider  acetaminophen (TYLENOL) 325 MG tablet Take 650 mg by mouth every 6 (six) hours as needed for mild pain or fever.    [provider]  acetaminophen (TYLENOL) 500 MG tablet Take 500 mg by mouth 2 (two) times daily.    [provider]  ARIPiprazole (ABILIFY) 2 MG tablet Take 2 mg by mouth every Monday, Wednesday, and Friday.    [provider]  aspirin EC 81 MG tablet Take 81 mg by mouth daily.    [provider]  bisacodyl (DULCOLAX) 5 MG EC tablet Take 5 mg by mouth at bedtime.    [provider]  cefUROXime (CEFTIN) 500 MG tablet Take 1 tablet (500 mg total) by mouth 2 (two) times daily. 12/03/16   Frederich Cha, MD  cetirizine (ZYRTEC) 10 MG tablet Take 10 mg by mouth daily.    [provider]  Cholecalciferol (D3-1000 PO) Take 1,000 Units by mouth daily.    [provider]    docusate sodium (COLACE) 100 MG capsule Take 200 mg by mouth daily.    [provider]  DULoxetine (CYMBALTA) 60 MG capsule Take 60 mg by mouth daily.    [provider]  gabapentin (NEURONTIN) 600 MG tablet Take 600 mg by mouth at bedtime.    [provider]  levothyroxine (SYNTHROID, LEVOTHROID) 100 MCG tablet Take 100 mcg by mouth daily before breakfast.    [provider]  lithium carbonate 300 MG capsule Take 300 mg by mouth 2 (two) times daily with a meal.    [provider]  Melatonin 3 MG TABS Take 3 mg by mouth at bedtime.    [provider]  memantine (NAMENDA XR) 28 MG CP24 24 hr capsule Take 28 mg by mouth daily.    [provider]  Menthol (ICY HOT BACK) 5 % PTCH Apply 1 patch topically daily.    [provider]  modafinil (PROVIGIL) 200 MG tablet Take 200 mg by mouth every Sunday.    [provider]  morphine (MS CONTIN) 15 MG 12 hr tablet Take 1 tablet (15 mg total) by mouth every 12 (twelve) hours. 11/24/16   Earleen Newport, MD  Multiple Vitamins-Minerals (CERTAVITE SENIOR/ANTIOXIDANT PO) Take 1 tablet by mouth daily.    [provider]  Omega-3 Fatty Acids (  FISH OIL) 1000 MG CAPS Take 1,000 mg by mouth daily.    [provider]  oxyCODONE-acetaminophen (PERCOCET) 5-325 MG tablet Take 2 tablets by mouth every 6 (six) hours as needed for moderate pain or severe pain. Patient taking differently: Take 1 tablet by mouth every 4 (four) hours as needed (breakthrough pain).  08/12/15   Earleen Newport, MD  polyethylene glycol (MIRALAX / Floria Raveling) packet Take 17 g by mouth daily.    [provider]  simvastatin (ZOCOR) 20 MG tablet Take 20 mg by mouth at bedtime.    [provider]  tamsulosin (FLOMAX) 0.4 MG CAPS capsule Take 0.4 mg by mouth daily after supper.    [provider]     Allergies Patient has no known allergies.  No family history on  file.  Social History Social History   Tobacco Use  . Smoking status: Former Research scientist (life sciences)  . Smokeless tobacco: Never Used  Substance Use Topics  . Alcohol use: No  . Drug use: No    Review of Systems  Constitutional: No dizziness Eyes: No visual changes.  ENT: No neck pain Cardiovascular: Denies chest pain. Respiratory: Denies shortness of breath. Gastrointestinal: No abdominal pain. Genitourinary: Negative for groin injury Musculoskeletal: Negative for extremity injury Skin: Laceration of the left eye Neurological: Negative for headaches    ____________________________________________   PHYSICAL EXAM:  VITAL SIGNS: ED Triage Vitals [06/20/17 2052]  Enc Vitals Group     BP (!) 153/84     Pulse Rate 60     Resp 14     Temp 97.7 F (36.5 C)     Temp Source Oral     SpO2 97 %     Weight 83.5 kg (184 lb)     Height 1.702 m (5\' 7" )     Head Circumference      Peak Flow      Pain Score 3     Pain Loc      Pain Edu?      Excl. in [place]?     Constitutional: Alert and oriented. No acute distress. Pleasant and interactive Eyes: Conjunctivae are normal.   Nose: No swelling or epistaxis Mouth/Throat: Mucous membranes are moist.   Neck: No vertebral tenderness palpation, normal range of motion without pain, no pain with axial load Cardiovascular: Normal rate, regular rhythm. Grossly normal heart sounds.  Good peripheral circulation. Respiratory: Normal respiratory effort.  No retractions. Lungs CTAB. Gastrointestinal: Soft and nontender. No distention.  No CVA tenderness. Genitourinary: deferred Musculoskeletal: Full range of motion of all extremities, no clavicular pain.  No vertebral tenderness palpation of the back.  No pain with axial load on both hips. Neurologic:  Normal speech and language. No gross focal neurologic deficits are appreciated.  Skin:  Skin is warm, dry.  Hematoma above the left eye, suspect small laceration Psychiatric: Mood and affect are normal.  Speech and behavior are normal.  ____________________________________________   LABS (all labs ordered are listed, but only abnormal results are displayed)  Labs Reviewed - No data to display ____________________________________________  EKG   ____________________________________________  RADIOLOGY  CT head no acute injury ____________________________________________   PROCEDURES  Procedure(s) performed: No  Procedures   Critical Care performed: No ____________________________________________   INITIAL IMPRESSION / ASSESSMENT AND PLAN / ED COURSE  Pertinent labs & imaging results that were available during my care of the patient were reviewed by me and considered in my medical decision making (see chart for details).  Patient well-appearing  in no acute distress, mechanical fall with head injury.  He is on aspirin.  Will obtain CT head, clean wound to determine whether sutures are necessary  Patient with mild abrasion to the face, no indication for repair, cleaned and dressed.  Tetanus updated.  CT head unremarkable.  Okay for discharge at this time    ____________________________________________   FINAL CLINICAL IMPRESSION(S) / ED DIAGNOSES  Final diagnoses:  Injury of head, initial encounter        Note:  This document was prepared using Dragon voice recognition software and may include unintentional dictation errors.    Lavonia Drafts, MD 06/20/17 2251

## 2017-06-30 ENCOUNTER — Other Ambulatory Visit: Payer: Self-pay

## 2017-06-30 ENCOUNTER — Observation Stay
Admission: EM | Admit: 2017-06-30 | Discharge: 2017-07-01 | Disposition: A | Discharge status: Home / Self Care | Payer: Medicare Other | Attending: Internal Medicine | Admitting: Internal Medicine

## 2017-06-30 ENCOUNTER — Encounter: Payer: Self-pay | Admitting: *Deleted

## 2017-06-30 ENCOUNTER — Emergency Department: Payer: Medicare Other

## 2017-06-30 DIAGNOSIS — D649 Anemia, unspecified: Secondary | ICD-10-CM | POA: Insufficient documentation

## 2017-06-30 DIAGNOSIS — R079 Chest pain, unspecified: Secondary | ICD-10-CM | POA: Diagnosis present

## 2017-06-30 DIAGNOSIS — K922 Gastrointestinal hemorrhage, unspecified: Secondary | ICD-10-CM | POA: Diagnosis present

## 2017-06-30 DIAGNOSIS — G8929 Other chronic pain: Secondary | ICD-10-CM | POA: Insufficient documentation

## 2017-06-30 DIAGNOSIS — Z7982 Long term (current) use of aspirin: Secondary | ICD-10-CM | POA: Insufficient documentation

## 2017-06-30 DIAGNOSIS — M549 Dorsalgia, unspecified: Secondary | ICD-10-CM | POA: Diagnosis not present

## 2017-06-30 DIAGNOSIS — M5136 Other intervertebral disc degeneration, lumbar region: Secondary | ICD-10-CM | POA: Insufficient documentation

## 2017-06-30 DIAGNOSIS — F329 Major depressive disorder, single episode, unspecified: Secondary | ICD-10-CM | POA: Diagnosis not present

## 2017-06-30 DIAGNOSIS — G809 Cerebral palsy, unspecified: Secondary | ICD-10-CM | POA: Insufficient documentation

## 2017-06-30 DIAGNOSIS — Z79899 Other long term (current) drug therapy: Secondary | ICD-10-CM | POA: Insufficient documentation

## 2017-06-30 DIAGNOSIS — K219 Gastro-esophageal reflux disease without esophagitis: Secondary | ICD-10-CM | POA: Diagnosis not present

## 2017-06-30 DIAGNOSIS — R0789 Other chest pain: Secondary | ICD-10-CM | POA: Diagnosis not present

## 2017-06-30 DIAGNOSIS — Z96649 Presence of unspecified artificial hip joint: Secondary | ICD-10-CM | POA: Insufficient documentation

## 2017-06-30 DIAGNOSIS — Z87891 Personal history of nicotine dependence: Secondary | ICD-10-CM | POA: Insufficient documentation

## 2017-06-30 LAB — TROPONIN I
Troponin I: <0.03 ng/mL (ref <0.03)
Troponin I: <0.03 ng/mL (ref <0.03)

## 2017-06-30 LAB — BASIC METABOLIC PANEL
ANION GAP: 6 (ref 5–15)
BUN: 16 mg/dL (ref 6–20)
CALCIUM: 9 mg/dL (ref 8.9–10.3)
CO2: 27 mmol/L (ref 22–32)
Chloride: 107 mmol/L (ref 101–111)
Creatinine, Ser: 0.79 mg/dL (ref 0.61–1.24)
Glucose, Bld: 96 mg/dL (ref 65–99)
Potassium: 3.9 mmol/L (ref 3.5–5.1)
Sodium: 140 mmol/L (ref 135–145)

## 2017-06-30 LAB — CBC
HEMATOCRIT: 22.3 % — AB (ref 40.0–52.0)
Hemoglobin: 7.4 g/dL — ABNORMAL LOW (ref 13.0–18.0)
MCH: 30.1 pg (ref 26.0–34.0)
MCHC: 33.2 g/dL (ref 32.0–36.0)
MCV: 90.7 fL (ref 80.0–100.0)
Platelets: 185 10*3/uL (ref 150–440)
RBC: 2.46 MIL/uL — AB (ref 4.40–5.90)
RDW: 15.9 % — AB (ref 11.5–14.5)
WBC: 4.2 10*3/uL (ref 3.8–10.6)

## 2017-06-30 LAB — ABO/RH: ABO/RH(D): O POS

## 2017-06-30 MED ORDER — MEMANTINE HCL ER 28 MG PO CP24
28.0000 mg | ORAL_CAPSULE | Freq: Every day | ORAL | Status: DC
  Administered 2017-07-01: 28 mg via ORAL
  Filled 2017-06-30: qty 1

## 2017-06-30 MED ORDER — ONDANSETRON HCL 4 MG PO TABS
4.0000 mg | ORAL_TABLET | Freq: Four times a day (QID) | ORAL | Status: DC | PRN
Start: 2017-06-30 — End: 2017-07-01

## 2017-06-30 MED ORDER — LEVOTHYROXINE SODIUM 100 MCG PO TABS
100.0000 ug | ORAL_TABLET | Freq: Every day | ORAL | Status: DC
  Administered 2017-07-01: 100 ug via ORAL
  Filled 2017-06-30: qty 1

## 2017-06-30 MED ORDER — TAMSULOSIN HCL 0.4 MG PO CAPS
0.4000 mg | ORAL_CAPSULE | Freq: Every day | ORAL | Status: DC
  Administered 2017-07-01: 0.4 mg via ORAL
  Filled 2017-06-30: qty 1

## 2017-06-30 MED ORDER — SIMVASTATIN 20 MG PO TABS
20.0000 mg | ORAL_TABLET | Freq: Every day | ORAL | Status: DC
  Administered 2017-07-01: 20 mg via ORAL
  Filled 2017-06-30 (×2): qty 1

## 2017-06-30 MED ORDER — SODIUM CHLORIDE 0.9 % IV SOLN
INTRAVENOUS | Status: DC
  Administered 2017-06-30 – 2017-07-01 (×2): via INTRAVENOUS

## 2017-06-30 MED ORDER — ACETAMINOPHEN 650 MG RE SUPP: 650.0000 mg | Freq: Four times a day (QID) | RECTAL | Status: DC | PRN

## 2017-06-30 MED ORDER — MELATONIN 5 MG PO TABS
5.0000 mg | ORAL_TABLET | Freq: Every day | ORAL | Status: DC
  Administered 2017-07-01: 5 mg via ORAL
  Filled 2017-06-30 (×2): qty 1

## 2017-06-30 MED ORDER — LITHIUM CARBONATE 300 MG PO CAPS
300.0000 mg | ORAL_CAPSULE | Freq: Two times a day (BID) | ORAL | Status: DC
  Administered 2017-07-01 (×2): 300 mg via ORAL
  Filled 2017-06-30 (×2): qty 1

## 2017-06-30 MED ORDER — FAMOTIDINE IN NACL 20-0.9 MG/50ML-% IV SOLN
20.0000 mg | Freq: Two times a day (BID) | INTRAVENOUS | Status: DC
  Administered 2017-07-01: 20 mg via INTRAVENOUS
  Filled 2017-06-30: qty 50

## 2017-06-30 MED ORDER — MODAFINIL 100 MG PO TABS: 200.0000 mg | ORAL_TABLET | ORAL | Status: DC

## 2017-06-30 MED ORDER — ACETAMINOPHEN 325 MG PO TABS: 650.0000 mg | ORAL_TABLET | Freq: Four times a day (QID) | ORAL | Status: DC | PRN

## 2017-06-30 MED ORDER — ONDANSETRON HCL 4 MG/2ML IJ SOLN: 4.0000 mg | Freq: Four times a day (QID) | INTRAMUSCULAR | Status: DC | PRN

## 2017-06-30 MED ORDER — ARIPIPRAZOLE 2 MG PO TABS: 2.0000 mg | ORAL_TABLET | ORAL | Status: DC

## 2017-06-30 MED ORDER — SODIUM CHLORIDE 0.9 % IV SOLN
80.0000 mg | Freq: Once | INTRAVENOUS | Status: AC
  Administered 2017-06-30: 19:00:00 80 mg via INTRAVENOUS
  Filled 2017-06-30: qty 80

## 2017-06-30 MED ORDER — SODIUM CHLORIDE 0.9 % IV SOLN: 10.0000 mL/h | Freq: Once | INTRAVENOUS | Status: DC

## 2017-06-30 NOTE — ED Notes (Signed)
Report called to [facility]

## 2017-06-30 NOTE — H&P (Signed)
[facility] NAME: Bob [name]    MR#:  [id number]  DATE OF BIRTH:  02-18-1946  DATE OF ADMISSION:  06/30/2017  PRIMARY CARE PHYSICIAN: Patient, No Pcp Per   REQUESTING/REFERRING PHYSICIAN:   CHIEF COMPLAINT:   Chief Complaint  Patient presents with  . Chest Pain    HISTORY OF PRESENT ILLNESS: Bob [name]  is a 72 y.o. male with a known history of cerebral palsy, cognitive impairment, GERD, degenerative disc disease, chronic back pain was referred from [facility] facility for chest discomfort.  Patient felt something was crawling from his abdomen onto his chest.  Secondary to the pain the EMS was called to the facility.  Patient does not have any dizziness, diaphoresis, nausea and vomiting.  EKG normal sinus rhythm with no ST segment changes according to the EMS.  He was given oral aspirin and brought to the emergency room.During the work-up in the emergency room hemoglobin was low.  His stool guaiac was checked which was positive.  No vomiting or blood.  PAST MEDICAL HISTORY:   Past Medical History:  Diagnosis Date  . Back pain   . Cerebral palsy ([place])   . DDD (degenerative disc disease), lumbar   . Depression   . Depression   . GERD (gastroesophageal reflux disease)     PAST SURGICAL HISTORY:  Past Surgical History:  Procedure Laterality Date  . TOTAL HIP ARTHROPLASTY      SOCIAL HISTORY:  Social History   Tobacco Use  . Smoking status: Former Research scientist (life sciences)  . Smokeless tobacco: Never Used  Substance Use Topics  . Alcohol use: No    FAMILY HISTORY: No family history on file.  DRUG ALLERGIES: No Known Allergies  REVIEW OF SYSTEMS:   CONSTITUTIONAL: No fever, fatigue or weakness.  EYES: No blurred or double vision.  EARS, NOSE, AND THROAT: No tinnitus or ear pain.  RESPIRATORY: No cough, shortness of breath, wheezing or hemoptysis.  CARDIOVASCULAR: Had chest pain, no orthopnea, edema.  GASTROINTESTINAL:  No nausea, vomiting, diarrhea or abdominal pain.  GENITOURINARY: No dysuria, hematuria.  ENDOCRINE: No polyuria, nocturia,  HEMATOLOGY: No anemia, easy bruising or bleeding SKIN: No rash or lesion. MUSCULOSKELETAL: No joint pain or arthritis.   NEUROLOGIC: No tingling, numbness, weakness.  PSYCHIATRY: No anxiety or depression.   MEDICATIONS AT HOME:  Prior to Admission medications   Medication Sig Start Date End Date Taking? Authorizing Provider  ARIPiprazole (ABILIFY) 2 MG tablet Take 2 mg by mouth every Monday, Wednesday, and Friday.   Yes [provider]  aspirin EC 81 MG tablet Take 81 mg by mouth daily.   Yes [provider]  bisacodyl (DULCOLAX) 5 MG EC tablet Take 5 mg by mouth at bedtime.   Yes [provider]  cetirizine (ZYRTEC) 10 MG tablet Take 10 mg by mouth daily.   Yes [provider]  Cholecalciferol (D3-1000 PO) Take 1,000 Units by mouth daily.   Yes [provider]  docusate sodium (COLACE) 100 MG capsule Take 100 mg by mouth daily.    Yes [provider]  DULoxetine (CYMBALTA) 60 MG capsule Take 60 mg by mouth daily.   Yes [provider]  gabapentin (NEURONTIN) 600 MG tablet Take 600 mg by mouth at bedtime.   Yes [provider]  levothyroxine (SYNTHROID, LEVOTHROID) 100 MCG tablet Take 100 mcg by mouth daily before breakfast.   Yes [provider]  lithium carbonate 300 MG capsule Take  300 mg by mouth 2 (two) times daily with a meal.   Yes [provider]  Melatonin 3 MG TABS Take 3 mg by mouth at bedtime.   Yes [provider]  memantine (NAMENDA XR) 28 MG CP24 24 hr capsule Take 28 mg by mouth daily.   Yes [provider]  modafinil (PROVIGIL) 200 MG tablet Take 200 mg by mouth every Sunday.   Yes [provider]  morphine (MS CONTIN) 15 MG 12 hr tablet Take 1 tablet (15 mg total) by mouth every 12 (twelve) hours. 11/24/16  Yes Earleen Newport, MD   Multiple Vitamins-Minerals (CERTAVITE SENIOR/ANTIOXIDANT PO) Take 1 tablet by mouth daily.   Yes [provider]  polyethylene glycol (MIRALAX / GLYCOLAX) packet Take 17 g by mouth daily.   Yes [provider]  simvastatin (ZOCOR) 20 MG tablet Take 20 mg by mouth at bedtime.   Yes [provider]  tamsulosin (FLOMAX) 0.4 MG CAPS capsule Take 0.4 mg by mouth daily after supper.   Yes [provider]  trolamine salicylate (ASPERCREME) 10 % cream Apply 1 application topically as needed for muscle pain (FOR HANDS AND RIGHT SHOULDER).   Yes [provider]  acetaminophen (TYLENOL) 325 MG tablet Take 650 mg by mouth every 6 (six) hours as needed for mild pain or fever.    [provider]  acetaminophen (TYLENOL) 500 MG tablet Take 500 mg by mouth 2 (two) times daily.    [provider]  cefUROXime (CEFTIN) 500 MG tablet Take 1 tablet (500 mg total) by mouth 2 (two) times daily. Patient not taking: Reported on 06/30/2017 12/03/16   Frederich Cha, MD  Menthol (ICY HOT BACK) 5 % [facility] Apply 1 patch topically daily.    [provider]  oxyCODONE-acetaminophen (PERCOCET) 5-325 MG tablet Take 2 tablets by mouth every 6 (six) hours as needed for moderate pain or severe pain. Patient taking differently: Take 1 tablet by mouth every 4 (four) hours as needed (breakthrough pain).  08/12/15   Earleen Newport, MD      PHYSICAL EXAMINATION:   VITAL SIGNS: Blood pressure (!) 147/84, pulse (!) 50, temperature 98.4 F (36.9 C), temperature source Oral, resp. rate 19, height 5\' 7"  (1.702 m), weight 83.5 kg (184 lb), SpO2 95 %.  GENERAL:  72 y.o.-year-old patient lying in the bed with no acute distress.  EYES: Pupils equal, round, reactive to light and accommodation. No scleral icterus. Extraocular muscles intact.  HEENT: Head atraumatic, normocephalic. Oropharynx and nasopharynx clear.  NECK:  Supple, no jugular venous distention. No thyroid  enlargement, no tenderness.  LUNGS: Normal breath sounds bilaterally, no wheezing, rales,rhonchi or crepitation. No use of accessory muscles of respiration.  CARDIOVASCULAR: S1, S2 normal. No murmurs, rubs, or gallops.  ABDOMEN: Soft, nontender, nondistended. Bowel sounds present. No organomegaly or mass.  EXTREMITIES: No pedal edema, cyanosis, or clubbing.  NEUROLOGIC: Cranial nerves II through XII are intact. Muscle strength 5/5 in all extremities. Sensation intact. Gait not checked.  PSYCHIATRIC: The patient is alert and oriented x 3.  SKIN: No obvious rash, lesion, or ulcer.   LABORATORY PANEL:   CBC Recent Labs  Lab 06/30/17 1711  WBC 4.2  HGB 7.4*  HCT 22.3*  PLT 185  MCV 90.7  MCH 30.1  MCHC 33.2  RDW 15.9*   ------------------------------------------------------------------------------------------------------------------  Chemistries  Recent Labs  Lab 06/30/17 1711  NA 140  K 3.9  CL 107  CO2 27  GLUCOSE 96  BUN 16  CREATININE 0.79  CALCIUM 9.0   ------------------------------------------------------------------------------------------------------------------ estimated creatinine clearance is 87.6 mL/min (by C-G formula based on SCr of 0.79 mg/dL). ------------------------------------------------------------------------------------------------------------------ No results for input(s): TSH, T4TOTAL, T3FREE, THYROIDAB in the last 72 hours.  Invalid input(s): FREET3   Coagulation profile No results for input(s): INR, PROTIME in the last 168 hours. ------------------------------------------------------------------------------------------------------------------- No results for input(s): DDIMER in the last 72 hours. -------------------------------------------------------------------------------------------------------------------  Cardiac Enzymes Recent Labs  Lab 06/30/17 1711  TROPONINI <0.03    ------------------------------------------------------------------------------------------------------------------ Invalid input(s): POCBNP  ---------------------------------------------------------------------------------------------------------------  Urinalysis    Component Value Date/Time   COLORURINE YELLOW (A) 11/24/2016 1726   APPEARANCEUR CLEAR (A) 11/24/2016 1726   LABSPEC 1.012 11/24/2016 1726   PHURINE 6.0 11/24/2016 1726   GLUCOSEU NEGATIVE 11/24/2016 1726   HGBUR NEGATIVE 11/24/2016 1726   BILIRUBINUR NEGATIVE 11/24/2016 1726   KETONESUR NEGATIVE 11/24/2016 1726   PROTEINUR NEGATIVE 11/24/2016 1726   NITRITE NEGATIVE 11/24/2016 1726   LEUKOCYTESUR NEGATIVE 11/24/2016 1726     RADIOLOGY: Dg Chest 2 View  Result Date: 06/30/2017 CLINICAL DATA:  Chest pain EXAM: CHEST - 2 VIEW COMPARISON:  None. FINDINGS: Low lung volumes. Mild cardiomegaly. No pneumothorax. No pleural effusion. Degenerative changes of the spine and shoulders. IMPRESSION: Low lung volumes with mild cardiomegaly Electronically Signed   By: Donavan Foil M.D.   On: 06/30/2017 18:23    EKG: Orders placed or performed during the hospital encounter of 11/24/16  . ED EKG  . ED EKG  . EKG 12-Lead  . EKG 12-Lead    IMPRESSION AND PLAN: 72 year old male patient with history of cerebral palsy, degenerative disc disease, depression, GERD was referred from a [facility]age facility for chest discomfort .  1.  Atypical chest pain Cycle troponin to rule out ischemia  2.  Anemia with guaiac positive stool Gastroenterology consultation IV Pepcid Hold aspirin  3.  Cerebral palsy Supportive care  4.  GERD Continue Pepcid  5.  DVT prophylaxis sequential compression device lower extremities  All the records are reviewed and case discussed with ED provider. Management plans discussed with the patient, family and they are in agreement.  CODE STATUS:Full code    TOTAL TIME TAKING CARE OF THIS PATIENT: 52  minutes.    Saundra Shelling M.D on 06/30/2017 at 8:05 PM  Between 7am to 6pm - Pager - [phone]  After 6pm go to [email or web address] - password [email or web address] [facility]  Office  [phone]  CC: Primary care physician; Patient, No Pcp Per

## 2017-06-30 NOTE — ED Triage Notes (Signed)
Pt brought in via ems from [facility] with chest pain.  Pt alert.  Hx CP.  md at bedside  Iv in place.

## 2017-06-30 NOTE — ED Notes (Signed)
Pt brought in via ems from [facility].  Pt has episode of chest pain lasting about 10 minutes today.  No pain on arrival to er.  No sob.  md at bedside.  Iv in place.

## 2017-06-30 NOTE — ED Provider Notes (Signed)
[facility] Emergency Department Provider Note  ____________________________________________  Time seen: Approximately 5:09 PM  I have reviewed the triage vital signs and the nursing notes.   HISTORY  Chief Complaint Chest Pain   HPI Bob [name] is a 72 y.o. male with a history of cerebral palsy, mild cognitive impairment, GERD, and depression who presents for evaluation of chest pain.  Patient reports that he was drinking soda at the skilled nursing facility when he felt a sensation of something crawling down his chest onto his abdomen.  He said this sensation lasted about 10 minutes and resolved without intervention.  According to EMS patient told skilled nursing facility that he was having 6 out of 10 central chest pain which is why they called 911.  Patient denies any associated symptoms such as nausea, vomiting, diaphoresis, dizziness, or shortness of breath.  Her initial EKG nonischemic per EMS.  He was given 325 mg of aspirin.  Patient remains completely asymptomatic at this time.  Has a remote history of smoking.  Has family history of heart attacks in his father.  No personal history of heart attack.  No personal family history of blood clots, recent travel immobilization, leg pain or swelling, or hemoptysis.  Past Medical History:  Diagnosis Date  . Back pain   . Cerebral palsy ([place])   . DDD (degenerative disc disease), lumbar   . Depression   . Depression   . GERD (gastroesophageal reflux disease)      Past Surgical History:  Procedure Laterality Date  . TOTAL HIP ARTHROPLASTY      Prior to Admission medications   Medication Sig Start Date End Date Taking? Authorizing Provider  acetaminophen (TYLENOL) 325 MG tablet Take 650 mg by mouth every 6 (six) hours as needed for mild pain or fever.    [provider]  acetaminophen (TYLENOL) 500 MG tablet Take 500 mg by mouth 2 (two) times daily.    [provider]  ARIPiprazole  (ABILIFY) 2 MG tablet Take 2 mg by mouth every Monday, Wednesday, and Friday.    [provider]  aspirin EC 81 MG tablet Take 81 mg by mouth daily.    [provider]  bisacodyl (DULCOLAX) 5 MG EC tablet Take 5 mg by mouth at bedtime.    [provider]  cefUROXime (CEFTIN) 500 MG tablet Take 1 tablet (500 mg total) by mouth 2 (two) times daily. 12/03/16   Frederich Cha, MD  cetirizine (ZYRTEC) 10 MG tablet Take 10 mg by mouth daily.    [provider]  Cholecalciferol (D3-1000 PO) Take 1,000 Units by mouth daily.    [provider]  docusate sodium (COLACE) 100 MG capsule Take 200 mg by mouth daily.    [provider]  DULoxetine (CYMBALTA) 60 MG capsule Take 60 mg by mouth daily.    [provider]  gabapentin (NEURONTIN) 600 MG tablet Take 600 mg by mouth at bedtime.    [provider]  levothyroxine (SYNTHROID, LEVOTHROID) 100 MCG tablet Take 100 mcg by mouth daily before breakfast.    [provider]  lithium carbonate 300 MG capsule Take 300 mg by mouth 2 (two) times daily with a meal.    [provider]  Melatonin 3 MG TABS Take 3 mg by mouth at bedtime.    [provider]  memantine (NAMENDA XR) 28 MG CP24 24 hr capsule Take 28 mg by mouth daily.    [provider]  Menthol (  ICY HOT BACK) 5 % [facility] Apply 1 patch topically daily.    [provider]  modafinil (PROVIGIL) 200 MG tablet Take 200 mg by mouth every Sunday.    [provider]  morphine (MS CONTIN) 15 MG 12 hr tablet Take 1 tablet (15 mg total) by mouth every 12 (twelve) hours. 11/24/16   Earleen Newport, MD  Multiple Vitamins-Minerals (CERTAVITE SENIOR/ANTIOXIDANT PO) Take 1 tablet by mouth daily.    [provider]  Omega-3 Fatty Acids (FISH OIL) 1000 MG CAPS Take 1,000 mg by mouth daily.    [provider]  oxyCODONE-acetaminophen (PERCOCET) 5-325 MG tablet Take 2 tablets by mouth  every 6 (six) hours as needed for moderate pain or severe pain. Patient taking differently: Take 1 tablet by mouth every 4 (four) hours as needed (breakthrough pain).  08/12/15   Earleen Newport, MD  polyethylene glycol (MIRALAX / Floria Raveling) packet Take 17 g by mouth daily.    [provider]  simvastatin (ZOCOR) 20 MG tablet Take 20 mg by mouth at bedtime.    [provider]  tamsulosin (FLOMAX) 0.4 MG CAPS capsule Take 0.4 mg by mouth daily after supper.    [provider]    Allergies Patient has no known allergies.  FH Coronary artery disease Father    Social History Social History   Tobacco Use  . Smoking status: Former Research scientist (life sciences)  . Smokeless tobacco: Never Used  Substance Use Topics  . Alcohol use: No  . Drug use: No    Review of Systems  Constitutional: Negative for fever. Eyes: Negative for visual changes. ENT: Negative for sore throat. Neck: No neck pain  Cardiovascular: + chest pain. Respiratory: Negative for shortness of breath. Gastrointestinal: Negative for abdominal pain, vomiting or diarrhea. Genitourinary: Negative for dysuria. Musculoskeletal: Negative for back pain. Skin: Negative for rash. Neurological: Negative for headaches, weakness or numbness. Psych: No SI or HI  ____________________________________________   PHYSICAL EXAM:  VITAL SIGNS: ED Triage Vitals  Enc Vitals Group     BP 06/30/17 1708 135/81     Pulse Rate 06/30/17 1708 (!) 59     Resp 06/30/17 1708 18     Temp 06/30/17 1708 98.4 F (36.9 C)     Temp Source 06/30/17 1708 Oral     SpO2 06/30/17 1708 96 %     Weight 06/30/17 1709 184 lb (83.5 kg)     Height 06/30/17 1709 5\' 7"  (1.702 m)     Head Circumference --      Peak Flow --      Pain Score 06/30/17 1709 0     Pain Loc --      Pain Edu? --      Excl. in [place]? --     Constitutional: Alert and oriented. Well appearing and in no apparent distress. HEENT:      Head: Normocephalic and atraumatic.          Eyes: Conjunctivae are normal. Sclera is non-icteric.       Mouth/Throat: Mucous membranes are moist.       Neck: Supple with no signs of meningismus. Cardiovascular: Regular rate and rhythm. No murmurs, gallops, or rubs. 2+ symmetrical distal pulses are present in all extremities. No JVD. Respiratory: Normal respiratory effort. Lungs are clear to auscultation bilaterally. No wheezes, crackles, or rhonchi.  Gastrointestinal: Soft, non tender, and non distended with positive bowel sounds. No rebound or guarding. Musculoskeletal: Nontender with normal range of motion in all extremities.  No edema, cyanosis, or erythema of extremities. Neurologic: Normal speech and language. Face is symmetric. Moving all extremities. No gross focal neurologic deficits are appreciated. Skin: Skin is warm, dry and intact. No rash noted. Psychiatric: Mood and affect are normal. Speech and behavior are normal.  ____________________________________________   LABS (all labs ordered are listed, but only abnormal results are displayed)  Labs Reviewed  CBC - Abnormal; Notable for the following components:      Result Value   RBC 2.46 (*)    Hemoglobin 7.4 (*)    HCT 22.3 (*)    RDW 15.9 (*)    All other components within normal limits  BASIC METABOLIC PANEL  TROPONIN I  TYPE AND SCREEN  PREPARE RBC (CROSSMATCH)   ____________________________________________  EKG  ED ECG REPORT I, Rudene Re, the attending physician, personally viewed and interpreted this ECG.  Sinus bradycardia, rate of 54, normal intervals, normal axis, no ST elevations or depressions.  EKG is unchanged when compared to prior from 9/18 ____________________________________________  RADIOLOGY  I have personally reviewed the images performed during this visit and I agree with the Radiologist's read.   Interpretation by Radiologist:  Dg Chest 2 View  Result Date: 06/30/2017 CLINICAL DATA:  Chest pain EXAM: CHEST - 2 VIEW  COMPARISON:  None. FINDINGS: Low lung volumes. Mild cardiomegaly. No pneumothorax. No pleural effusion. Degenerative changes of the spine and shoulders. IMPRESSION: Low lung volumes with mild cardiomegaly Electronically Signed   By: Donavan Foil M.D.   On: 06/30/2017 18:23      ____________________________________________   PROCEDURES  Procedure(s) performed: None Procedures Critical Care performed:  None ____________________________________________   INITIAL IMPRESSION / ASSESSMENT AND PLAN / ED COURSE  72 y.o. male with a history of cerebral palsy, mild cognitive impairment, GERD, and depression who presents for evaluation of chest pain.  Patient with a short-lived episode of atypical chest pain that he describes as a crawling sensation coming down his chest into his abdomen while drinking soda.  No prior history of coronary artery disease.  Initial EKG with no ischemic changes.  Due to history of mild cognitive impairment and the fact that it was reported to EMS the patient was complaining of central chest pain for 10 minutes we would do a cardiac workup including chest x-ray, troponin x2, and basic labs.  Patient received a full dose of aspirin per EMS.  He will be monitored on telemetry.    _________________________ 6:40 PM on 06/30/2017 -----------------------------------------  Labs showing new hemoglobin of 7.4.  Patient is not on blood thinners.  He has not noticed any bleeding.  He did have some black stools last week.  He reports a very remote history of GI bleed.  Rectal exam was done showing brown stool which was grossly bloody and guaiac positive.  Patient is hemodynamically stable.  We will give a dose of Protonix, get a type and screen, and prepare blood in case patient needs a transfusion.  Will discuss with hospitalist for admission.   As part of my medical decision making, I reviewed the following data within the [place] notes reviewed and  incorporated, Labs reviewed , EKG interpreted , Radiograph reviewed , Discussed with admitting physician , Notes from prior ED visits and [place] Controlled Substance Database    Pertinent labs & imaging results that were available during my care of the patient were reviewed by me and considered in my medical decision making (see chart for details).  ____________________________________________   FINAL CLINICAL IMPRESSION(S) / ED DIAGNOSES  Final diagnoses:  Gastrointestinal hemorrhage, unspecified gastrointestinal hemorrhage type  Severe anemia  Chest pain, unspecified type      NEW MEDICATIONS STARTED DURING THIS VISIT:  ED Discharge Orders    None       Note:  This document was prepared using Dragon voice recognition software and may include unintentional dictation errors.    Alfred Levins, [place], MD 06/30/17 [phone]

## 2017-07-01 DIAGNOSIS — D649 Anemia, unspecified: Secondary | ICD-10-CM | POA: Diagnosis not present

## 2017-07-01 LAB — TROPONIN I: Troponin I: <0.03 ng/mL (ref <0.03)

## 2017-07-01 LAB — BASIC METABOLIC PANEL
Anion gap: 3 — ABNORMAL LOW (ref 5–15)
BUN: 14 mg/dL (ref 6–20)
CO2: 26 mmol/L (ref 22–32)
Calcium: 8.7 mg/dL — ABNORMAL LOW (ref 8.9–10.3)
Chloride: 110 mmol/L (ref 101–111)
Creatinine, Ser: 0.83 mg/dL (ref 0.61–1.24)
GFR calc Af Amer: >60 mL/min (ref >60)
Glucose, Bld: 87 mg/dL (ref 65–99)
Potassium: 4 mmol/L (ref 3.5–5.1)
SODIUM: 139 mmol/L (ref 135–145)

## 2017-07-01 LAB — HEMOGLOBIN AND HEMATOCRIT, BLOOD
HCT: 34.2 % — ABNORMAL LOW (ref 40.0–52.0)
HCT: 36.4 % — ABNORMAL LOW (ref 40.0–52.0)
HEMOGLOBIN: 11.2 g/dL — AB (ref 13.0–18.0)
Hemoglobin: 12.3 g/dL — ABNORMAL LOW (ref 13.0–18.0)

## 2017-07-01 LAB — MRSA PCR SCREENING: MRSA BY PCR: NEGATIVE

## 2017-07-01 MED ORDER — PANTOPRAZOLE SODIUM 40 MG PO TBEC: 40.0000 mg | DELAYED_RELEASE_TABLET | Freq: Every day | ORAL | 0 refills | Status: DC

## 2017-07-01 MED ORDER — PANTOPRAZOLE SODIUM 40 MG PO TBEC: 40.0000 mg | DELAYED_RELEASE_TABLET | Freq: Two times a day (BID) | ORAL | Status: DC

## 2017-07-01 NOTE — Discharge Instructions (Signed)
Anemia Anemia is a condition in which you do not have enough red blood cells or hemoglobin. Hemoglobin is a substance in red blood cells that carries oxygen. When you do not have enough red blood cells or hemoglobin (are anemic), your body cannot get enough oxygen and your organs may not work properly. As a result, you may feel very tired or have other problems. What are the causes? Common causes of anemia include:  Excessive bleeding. Anemia can be caused by excessive bleeding inside or outside the body, including bleeding from the intestine or from periods in women.  Poor nutrition.  Long-lasting (chronic) kidney, thyroid, and liver disease.  Bone marrow disorders.  Cancer and treatments for cancer.  HIV (human immunodeficiency virus) and AIDS (acquired immunodeficiency syndrome).  Treatments for HIV and AIDS.  Spleen problems.  Blood disorders.  Infections, medicines, and autoimmune disorders that destroy red blood cells.  What are the signs or symptoms? Symptoms of this condition include:  Minor weakness.  Dizziness.  Headache.  Feeling heartbeats that are irregular or faster than normal (palpitations).  Shortness of breath, especially with exercise.  Paleness.  Cold sensitivity.  Indigestion.  Nausea.  Difficulty sleeping.  Difficulty concentrating.  Symptoms may occur suddenly or develop slowly. If your anemia is mild, you may not have symptoms. How is this diagnosed? This condition is diagnosed based on:  Blood tests.  Your medical history.  A physical exam.  Bone marrow biopsy.  Your health care provider may also check your stool (feces) for blood and may do additional testing to look for the cause of your bleeding. You may also have other tests, including:  Imaging tests, such as a CT scan or MRI.  Endoscopy.  Colonoscopy.  How is this treated? Treatment for this condition depends on the cause. If you continue to lose a lot of blood,  you may need to be treated at a hospital. Treatment may include:  Taking supplements of iron, vitamin T02, or folic acid.  Taking a hormone medicine (erythropoietin) that can help to stimulate red blood cell growth.  Having a blood transfusion. This may be needed if you lose a lot of blood.  Making changes to your diet.  Having surgery to remove your spleen.  Follow these instructions at home:  Take over-the-counter and prescription medicines only as told by your health care provider.  Take supplements only as told by your health care provider.  Follow any diet instructions that you were given.  Keep all follow-up visits as told by your health care provider. This is important. Contact a health care provider if:  You develop new bleeding anywhere in the body. Get help right away if:  You are very weak.  You are short of breath.  You have pain in your abdomen or chest.  You are dizzy or feel faint.  You have trouble concentrating.  You have bloody or black, tarry stools.  You vomit repeatedly or you vomit up blood. Summary  Anemia is a condition in which you do not have enough red blood cells or enough of a substance in your red blood cells that carries oxygen (hemoglobin).  Symptoms may occur suddenly or develop slowly.  If your anemia is mild, you may not have symptoms.  This condition is diagnosed with blood tests as well as a medical history and physical exam. Other tests may be needed.  Treatment for this condition depends on the cause of the anemia. This information is not intended to replace advice  given to you by your health care provider. Make sure you discuss any questions you have with your health care provider. °Document Released: 04/02/2004 Document Revised: 03/27/2016 Document Reviewed: 03/27/2016 °Elsevier Interactive Patient Education © 2018 [employer] ° °

## 2017-07-01 NOTE — NC FL2 (Signed)
[place] LEVEL OF CARE SCREENING TOOL     IDENTIFICATION  Patient Name: Bob [name] Birthdate: 08-08-1945 Sex: male Admission Date (Current Location): 06/30/2017  [place] and [place] Number:  Engineering geologist and Address:  [facility], [address], [place], [place] [address]      Provider Number: [id number]  Attending Physician Name and Address:  [name], MD  Relative Name and Phone Number:  August Luz [phone]     Current Level of Care: Hospital Recommended Level of Care: Assisted Living Facility([facility]) Prior Approval Number:    Date Approved/Denied:   PASRR Number:    Discharge Plan: Other (Comment)([facility])    Current Diagnoses: Patient Active Problem List   Diagnosis Date Noted  . GI bleed 06/30/2017    Orientation RESPIRATION BLADDER Height & Weight     Self, Time, Situation, Place  Normal Continent Weight: 184 lb (83.5 kg) Height:  5\' 7"  (170.2 cm)  BEHAVIORAL SYMPTOMS/MOOD NEUROLOGICAL BOWEL NUTRITION STATUS      Continent Diet(low sodium health)  AMBULATORY STATUS COMMUNICATION OF NEEDS Skin   Limited Assist Verbally Normal                       Personal Care Assistance Level of Assistance  Bathing, Feeding, Dressing Bathing Assistance: Limited assistance Feeding assistance: Independent Dressing Assistance: Limited assistance     Functional Limitations Info  Sight, Hearing, Speech Sight Info: Adequate Hearing Info: Adequate Speech Info: Impaired    SPECIAL CARE FACTORS FREQUENCY                       Contractures Contractures Info: Not present    Additional Factors Info  Code Status, Allergies, Psychotropic Code Status Info: Full Code Allergies Info: NKA Psychotropic Info: ARIPiprazole (ABILIFY) tablet 2 mg          Current Medications (07/01/2017):  This is the current hospital active medication list Current Facility-Administered  Medications  Medication Dose Route Frequency Provider Last Rate Last Dose  . 0.9 %  sodium chloride infusion  10 mL/hr Intravenous Once Alfred Levins, [place], MD      . 0.9 %  sodium chloride infusion   Intravenous Continuous Saundra Shelling, MD   Stopped at 07/01/17 1646  . acetaminophen (TYLENOL) tablet 650 mg  650 mg Oral Q6H PRN Saundra Shelling, MD       Or  . acetaminophen (TYLENOL) suppository 650 mg  650 mg Rectal Q6H PRN Saundra Shelling, MD      . Derrill Memo ON 07/02/2017] ARIPiprazole (ABILIFY) tablet 2 mg  2 mg Oral Q M,W,F Pyreddy, Pavan, MD      . levothyroxine (SYNTHROID, LEVOTHROID) tablet 100 mcg  100 mcg Oral QAC breakfast Saundra Shelling, MD   100 mcg at 07/01/17 0805  . lithium carbonate capsule 300 mg  300 mg Oral BID WC Pyreddy, Reatha Harps, MD   300 mg at 07/01/17 0805  . Melatonin TABS 5 mg  5 mg Oral QHS Pyreddy, Reatha Harps, MD   5 mg at 07/01/17 0030  . memantine (NAMENDA XR) 24 hr capsule 28 mg  28 mg Oral Daily Pyreddy, Pavan, MD   28 mg at 07/01/17 1006  . [START ON 07/04/2017] modafinil (PROVIGIL) tablet 200 mg  200 mg Oral Q Fredderick Severance, Reatha Harps, MD      . ondansetron [facility]) tablet 4 mg  4 mg Oral Q6H PRN Saundra Shelling, MD  Or  . ondansetron (ZOFRAN) injection 4 mg  4 mg Intravenous Q6H PRN Pyreddy, Pavan, MD      . pantoprazole (PROTONIX) EC tablet 40 mg  40 mg Oral BID [name], MD      . simvastatin (ZOCOR) tablet 20 mg  20 mg Oral QHS Saundra Shelling, MD   20 mg at 07/01/17 0029  . tamsulosin (FLOMAX) capsule 0.4 mg  0.4 mg Oral QPC supper Pyreddy, Reatha Harps, MD         Discharge Medications: STOP taking these medications   cefUROXime 500 MG tablet Commonly known as:  CEFTIN     TAKE these medications   acetaminophen 500 MG tablet Commonly known as:  TYLENOL Take 500 mg by mouth 2 (two) times daily.   acetaminophen 325 MG tablet Commonly known as:  TYLENOL Take 650 mg by mouth every 6 (six) hours as needed for mild pain or fever.   ARIPiprazole 2 MG  tablet Commonly known as:  ABILIFY Take 2 mg by mouth every Monday, Wednesday, and Friday.   aspirin EC 81 MG tablet Take 81 mg by mouth daily.   bisacodyl 5 MG EC tablet Commonly known as:  DULCOLAX Take 5 mg by mouth at bedtime.   CERTAVITE SENIOR/ANTIOXIDANT PO Take 1 tablet by mouth daily.   cetirizine 10 MG tablet Commonly known as:  ZYRTEC Take 10 mg by mouth daily.   D3-1000 PO Take 1,000 Units by mouth daily.   docusate sodium 100 MG capsule Commonly known as:  COLACE Take 100 mg by mouth daily.   DULoxetine 60 MG capsule Commonly known as:  CYMBALTA Take 60 mg by mouth daily.   gabapentin 600 MG tablet Commonly known as:  NEURONTIN Take 600 mg by mouth at bedtime.   ICY HOT BACK 5 % Ptch Generic drug:  Menthol Apply 1 patch topically daily.   levothyroxine 100 MCG tablet Commonly known as:  SYNTHROID, LEVOTHROID Take 100 mcg by mouth daily before breakfast.   lithium carbonate 300 MG capsule Take 300 mg by mouth 2 (two) times daily with a meal.   Melatonin 3 MG Tabs Take 3 mg by mouth at bedtime.   memantine 28 MG Cp24 24 hr capsule Commonly known as:  NAMENDA XR Take 28 mg by mouth daily.   modafinil 200 MG tablet Commonly known as:  PROVIGIL Take 200 mg by mouth every Sunday.   morphine 15 MG 12 hr tablet Commonly known as:  MS CONTIN Take 1 tablet (15 mg total) by mouth every 12 (twelve) hours.   oxyCODONE-acetaminophen 5-325 MG tablet Commonly known as:  PERCOCET Take 2 tablets by mouth every 6 (six) hours as needed for moderate pain or severe pain. What changed:    how much to take  when to take this  reasons to take this   pantoprazole 40 MG tablet Commonly known as:  PROTONIX Take 1 tablet (40 mg total) by mouth daily.   polyethylene glycol packet Commonly known as:  MIRALAX / GLYCOLAX Take 17 g by mouth daily.   simvastatin 20 MG tablet Commonly known as:  ZOCOR Take 20 mg by mouth at bedtime.    tamsulosin 0.4 MG Caps capsule Commonly known as:  FLOMAX Take 0.4 mg by mouth daily after supper.   trolamine salicylate 10 % cream Commonly known as:  ASPERCREME Apply 1 application topically as needed for muscle pain (FOR HANDS AND RIGHT SHOULDER).      Relevant Imaging Results:  Relevant Lab Results:  Additional Information SSN [id number]  Ross Ludwig, [place]

## 2017-07-01 NOTE — Progress Notes (Addendum)
07/01/2017 5:59 PM  Bob [name] to be D/C'd Bob [name] per MD order.  Discussed prescriptions and follow up appointments with the patient. Prescriptions given to patient, medication list explained in detail. Pt verbalized understanding.  Allergies as of 07/01/2017   No Known Allergies     Medication List    STOP taking these medications   cefUROXime 500 MG tablet Commonly known as:  CEFTIN     TAKE these medications   acetaminophen 500 MG tablet Commonly known as:  TYLENOL Take 500 mg by mouth 2 (two) times daily.   acetaminophen 325 MG tablet Commonly known as:  TYLENOL Take 650 mg by mouth every 6 (six) hours as needed for mild pain or fever.   ARIPiprazole 2 MG tablet Commonly known as:  ABILIFY Take 2 mg by mouth every Monday, Wednesday, and Friday.   aspirin EC 81 MG tablet Take 81 mg by mouth daily.   bisacodyl 5 MG EC tablet Commonly known as:  DULCOLAX Take 5 mg by mouth at bedtime.   CERTAVITE SENIOR/ANTIOXIDANT PO Take 1 tablet by mouth daily.   cetirizine 10 MG tablet Commonly known as:  ZYRTEC Take 10 mg by mouth daily.   D3-1000 PO Take 1,000 Units by mouth daily.   docusate sodium 100 MG capsule Commonly known as:  COLACE Take 100 mg by mouth daily.   DULoxetine 60 MG capsule Commonly known as:  CYMBALTA Take 60 mg by mouth daily.   gabapentin 600 MG tablet Commonly known as:  NEURONTIN Take 600 mg by mouth at bedtime.   ICY HOT BACK 5 % Ptch Generic drug:  Menthol Apply 1 patch topically daily.   levothyroxine 100 MCG tablet Commonly known as:  SYNTHROID, LEVOTHROID Take 100 mcg by mouth daily before breakfast.   lithium carbonate 300 MG capsule Take 300 mg by mouth 2 (two) times daily with a meal.   Melatonin 3 MG Tabs Take 3 mg by mouth at bedtime.   memantine 28 MG Cp24 24 hr capsule Commonly known as:  NAMENDA XR Take 28 mg by mouth daily.   modafinil 200 MG tablet Commonly known as:  PROVIGIL Take 200 mg by mouth every  Sunday.   morphine 15 MG 12 hr tablet Commonly known as:  MS CONTIN Take 1 tablet (15 mg total) by mouth every 12 (twelve) hours.   oxyCODONE-acetaminophen 5-325 MG tablet Commonly known as:  PERCOCET Take 2 tablets by mouth every 6 (six) hours as needed for moderate pain or severe pain. What changed:    how much to take  when to take this  reasons to take this   pantoprazole 40 MG tablet Commonly known as:  PROTONIX Take 1 tablet (40 mg total) by mouth daily.   polyethylene glycol packet Commonly known as:  MIRALAX / GLYCOLAX Take 17 g by mouth daily.   simvastatin 20 MG tablet Commonly known as:  ZOCOR Take 20 mg by mouth at bedtime.   tamsulosin 0.4 MG Caps capsule Commonly known as:  FLOMAX Take 0.4 mg by mouth daily after supper.   trolamine salicylate 10 % cream Commonly known as:  ASPERCREME Apply 1 application topically as needed for muscle pain (FOR HANDS AND RIGHT SHOULDER).       Vitals:   07/01/17 0552 07/01/17 1531  BP: 130/80 (!) 165/86  Pulse: (!) 59 (!) 57  Resp: (!) 22 18  Temp: 98.1 F (36.7 C) 97.8 F (36.6 C)  SpO2: 96% 99%    Skin clean,  dry and intact without evidence of skin break down, no evidence of skin tears noted. IV catheter discontinued intact. Site without signs and symptoms of complications. Dressing and pressure applied. Pt denies pain at this time. No complaints noted.  An After Visit Summary was printed and given to the patient. Patient escorted and D/C via [facility] [place]

## 2017-07-01 NOTE — Care Management Obs Status (Signed)
[place] NOTIFICATION   Patient Details  Name: Bob [name] MRN: [id number] Date of Birth: 11/14/1945   Medicare Observation Status Notification Given:  Yes    Beverly Sessions, RN 07/01/2017, 5:08 PM

## 2017-07-01 NOTE — Consult Note (Signed)
Bob [name] , MD [address] [place], [place], [place], [address] [address], [place], [place], [place], [address] Phone: [phone]  Fax: [phone]  Consultation  Referring Provider: Dr Estanislado Pandy Primary Care Physician:  Patient, No Pcp Per Primary Gastroenterologist: None       Reason for Consultation:     Anemia   Date of Admission:  06/30/2017 Date of Consultation:  07/01/2017         HPI:   Sloan Takagi is a 72 y.o. male with a history of cerebral palsy , GERD admitted on 06/30/17 with chest discomfort . In the ER found to have a low HB 7.4 grams with MCV of 90 which was 12.9 grams 7 months back. Normal BUN/cr ratio. No overt blood loss.    Patient denies any abdominal pain , denies any blood in his stool or hematemesis. Initial Hb was lower than baseline but when rechecked has gone back up. Denies use of any NSAID's.   Past Medical History:  Diagnosis Date  . Back pain   . Cerebral palsy ([place])   . DDD (degenerative disc disease), lumbar   . Depression   . Depression   . GERD (gastroesophageal reflux disease)     Past Surgical History:  Procedure Laterality Date  . TOTAL HIP ARTHROPLASTY      Prior to Admission medications   Medication Sig Start Date End Date Taking? Authorizing Provider  ARIPiprazole (ABILIFY) 2 MG tablet Take 2 mg by mouth every Monday, Wednesday, and Friday.   Yes [provider]  aspirin EC 81 MG tablet Take 81 mg by mouth daily.   Yes [provider]  bisacodyl (DULCOLAX) 5 MG EC tablet Take 5 mg by mouth at bedtime.   Yes [provider]  cetirizine (ZYRTEC) 10 MG tablet Take 10 mg by mouth daily.   Yes [provider]  Cholecalciferol (D3-1000 PO) Take 1,000 Units by mouth daily.   Yes [provider]  docusate sodium (COLACE) 100 MG capsule Take 100 mg by mouth daily.    Yes [provider]  DULoxetine (CYMBALTA) 60 MG capsule Take 60 mg by mouth daily.   Yes [provider]  gabapentin (NEURONTIN) 600 MG tablet Take 600 mg by mouth at bedtime.   Yes [provider]  levothyroxine (SYNTHROID, LEVOTHROID) 100 MCG tablet Take 100 mcg by mouth daily before breakfast.   Yes [provider]  lithium carbonate 300 MG capsule Take 300 mg by mouth 2 (two) times daily with a meal.   Yes [provider]  Melatonin 3 MG TABS Take 3 mg by mouth at bedtime.   Yes [provider]  memantine (NAMENDA XR) 28 MG CP24 24 hr capsule Take 28 mg by mouth daily.   Yes [provider]  modafinil (PROVIGIL) 200 MG tablet Take 200 mg by mouth every Sunday.   Yes [provider]  morphine (MS CONTIN) 15 MG 12 hr tablet Take 1 tablet (15 mg total) by mouth every 12 (twelve) hours. 11/24/16  Yes Earleen Newport, MD  Multiple Vitamins-Minerals (CERTAVITE SENIOR/ANTIOXIDANT PO) Take 1 tablet by mouth daily.   Yes [provider]  polyethylene glycol (MIRALAX / GLYCOLAX) packet Take 17 g by mouth daily.   Yes [provider]  simvastatin (ZOCOR) 20 MG tablet Take 20 mg by mouth at bedtime.   Yes [provider]  tamsulosin (FLOMAX) 0.4 MG CAPS capsule Take 0.4 mg by mouth daily after supper.  Yes [provider]  trolamine salicylate (ASPERCREME) 10 % cream Apply 1 application topically as needed for muscle pain (FOR HANDS AND RIGHT SHOULDER).   Yes [provider]  acetaminophen (TYLENOL) 325 MG tablet Take 650 mg by mouth every 6 (six) hours as needed for mild pain or fever.    [provider]  acetaminophen (TYLENOL) 500 MG tablet Take 500 mg by mouth 2 (two) times daily.    [provider]  cefUROXime (CEFTIN) 500 MG tablet Take 1 tablet (500 mg total) by mouth 2 (two) times daily. Patient not taking: Reported on 06/30/2017 12/03/16   Frederich Cha, MD  Menthol (ICY HOT BACK) 5 % [facility] Apply 1 patch topically daily.    [provider]    oxyCODONE-acetaminophen (PERCOCET) 5-325 MG tablet Take 2 tablets by mouth every 6 (six) hours as needed for moderate pain or severe pain. Patient taking differently: Take 1 tablet by mouth every 4 (four) hours as needed (breakthrough pain).  08/12/15   Earleen Newport, MD    No family history on file.   Social History   Tobacco Use  . Smoking status: Former Research scientist (life sciences)  . Smokeless tobacco: Never Used  Substance Use Topics  . Alcohol use: No  . Drug use: No    Allergies as of 06/30/2017  . (No Known Allergies)    Review of Systems:    All systems reviewed and negative except where noted in HPI.   Physical Exam:  Vital signs in last 24 hours: Temp:  [97.8 F (36.6 C)-98.4 F (36.9 C)] 98.1 F (36.7 C) (04/25 0552) Pulse Rate:  [41-60] 59 (04/25 0552) Resp:  [13-22] 22 (04/25 0552) BP: (114-157)/(71-96) 130/80 (04/25 0552) SpO2:  [90 %-99 %] 96 % (04/25 0552) Weight:  [184 lb (83.5 kg)] 184 lb (83.5 kg) (04/24 1709) Last BM Date: 06/30/17 General:   Pleasant, cooperative in NAD Head:  Normocephalic and atraumatic. Eyes:   No icterus.   Conjunctiva pink. PERRLA. Ears:  Normal auditory acuity. Neck:  Supple; no masses or thyroidomegaly Lungs: Respirations even and unlabored. Lungs clear to auscultation bilaterally.   No wheezes, crackles, or rhonchi.  Heart:  Regular rate and rhythm;  Without murmur, clicks, rubs or gallops Abdomen:  Soft, nondistended, nontender. Normal bowel sounds. No appreciable masses or hepatomegaly.  No rebound or guarding.  Neurologic:  Alert and oriented x3; contractures of arms . Skin:  Intact without significant lesions or rashes. Cervical Nodes:  No significant cervical adenopathy. Psych:  Alert and cooperative. Normal affect.  LAB RESULTS: Recent Labs    06/30/17 1711 07/01/17 0424  WBC 4.2  --   HGB 7.4* 11.2*  HCT 22.3* 34.2*  PLT 185  --    BMET Recent Labs    06/30/17 1711 07/01/17 0424  NA 140 139  K 3.9 4.0  CL 107 110   CO2 27 26  GLUCOSE 96 87  BUN 16 14  CREATININE 0.79 0.83  CALCIUM 9.0 8.7*   LFT No results for input(s): PROT, ALBUMIN, AST, ALT, ALKPHOS, BILITOT, BILIDIR, IBILI in the last 72 hours. PT/INR No results for input(s): LABPROT, INR in the last 72 hours.  STUDIES: Dg Chest 2 View  Result Date: 06/30/2017 CLINICAL DATA:  Chest pain EXAM: CHEST - 2 VIEW COMPARISON:  None. FINDINGS: Low lung volumes. Mild cardiomegaly. No pneumothorax. No pleural effusion. Degenerative changes of the spine and shoulders. IMPRESSION: Low lung volumes with mild cardiomegaly Electronically Signed   By: Maudie Mercury  Francoise Ceo M.D.   On: 06/30/2017 18:23      Impression / Plan:   Sherod Cisse is a 72 y.o. y/o male with cerebral palsy . Admitted with hypotension and concern for GI bleed. No overt blood loss seen by anyone so far. Stool test was done which is a test for colon cancer screening and not for blood loss . His Hb was initially lower than baseline but on repeat check is back at baseline without ant transfusion. Unclear reason for hypotension   Plan  1. IV PPI 2. Check color of his stool- no stool occult testing. If blood seen on his stool or in his pad then needs further evaluation. If no bleeding seen and Hb stable needs no further evaluation as inpatient.   Thank you for involving me in the care of this patient.      LOS: 1 day   Bob Bellows, MD  07/01/2017, 10:31 AM

## 2017-07-01 NOTE — Discharge Summary (Signed)
[facility] NAME: Bob [name]    MR#:  [id number]  DATE OF BIRTH:  01-14-1946  DATE OF ADMISSION:  06/30/2017   ADMITTING PHYSICIAN: Saundra Shelling, MD  DATE OF DISCHARGE: 07/01/2017  PRIMARY CARE PHYSICIAN: Patient, No Pcp Per   ADMISSION DIAGNOSIS:  Severe anemia [D64.9] Gastrointestinal hemorrhage, unspecified gastrointestinal hemorrhage type [K92.2] Chest pain, unspecified type [R07.9] DISCHARGE DIAGNOSIS:  Active Problems:   GI bleed  SECONDARY DIAGNOSIS:   Past Medical History:  Diagnosis Date  . Back pain   . Cerebral palsy ([place])   . DDD (degenerative disc disease), lumbar   . Depression   . Depression   . GERD (gastroesophageal reflux disease)    HOSPITAL COURSE:  72 y.o. male with a history of cerebral palsy , GERD admittedwith chest discomfort . In the ER found to have a low HB 7.4 grams with MCV of 90 which was 12.9 grams 7 months back. Normal BUN/cr ratio. No overt blood loss.   - His Hb was initially lower (7.4) than baseline but on repeat check is back at baseline (11.2 and repeat 12.3) without ant transfusion. D/w Patient, family and GI - considering no overt bleeding and hemodynamic stability - will hold of luminal eval. Check color of his stool- no stool occult testing. If blood seen on his stool or in his pad then needs further evaluation. I have gone over this with patient's family.  - outpt GI f/up if need. DISCHARGE CONDITIONS:  stable CONSULTS OBTAINED:  Treatment Team:  Jonathon Bellows, MD DRUG ALLERGIES:  No Known Allergies DISCHARGE MEDICATIONS:   Allergies as of 07/01/2017   No Known Allergies     Medication List    STOP taking these medications   cefUROXime 500 MG tablet Commonly known as:  CEFTIN     TAKE these medications   acetaminophen 500 MG tablet Commonly known as:  TYLENOL Take 500 mg by mouth 2 (two) times daily.   acetaminophen 325 MG tablet Commonly known as:   TYLENOL Take 650 mg by mouth every 6 (six) hours as needed for mild pain or fever.   ARIPiprazole 2 MG tablet Commonly known as:  ABILIFY Take 2 mg by mouth every Monday, Wednesday, and Friday.   aspirin EC 81 MG tablet Take 81 mg by mouth daily.   bisacodyl 5 MG EC tablet Commonly known as:  DULCOLAX Take 5 mg by mouth at bedtime.   CERTAVITE SENIOR/ANTIOXIDANT PO Take 1 tablet by mouth daily.   cetirizine 10 MG tablet Commonly known as:  ZYRTEC Take 10 mg by mouth daily.   D3-1000 PO Take 1,000 Units by mouth daily.   docusate sodium 100 MG capsule Commonly known as:  COLACE Take 100 mg by mouth daily.   DULoxetine 60 MG capsule Commonly known as:  CYMBALTA Take 60 mg by mouth daily.   gabapentin 600 MG tablet Commonly known as:  NEURONTIN Take 600 mg by mouth at bedtime.   ICY HOT BACK 5 % Ptch Generic drug:  Menthol Apply 1 patch topically daily.   levothyroxine 100 MCG tablet Commonly known as:  SYNTHROID, LEVOTHROID Take 100 mcg by mouth daily before breakfast.   lithium carbonate 300 MG capsule Take 300 mg by mouth 2 (two) times daily with a meal.   Melatonin 3 MG Tabs Take 3 mg by mouth at bedtime.   memantine 28 MG Cp24 24 hr capsule Commonly known as:  NAMENDA XR Take 28  mg by mouth daily.   modafinil 200 MG tablet Commonly known as:  PROVIGIL Take 200 mg by mouth every Sunday.   morphine 15 MG 12 hr tablet Commonly known as:  MS CONTIN Take 1 tablet (15 mg total) by mouth every 12 (twelve) hours.   oxyCODONE-acetaminophen 5-325 MG tablet Commonly known as:  PERCOCET Take 2 tablets by mouth every 6 (six) hours as needed for moderate pain or severe pain. What changed:    how much to take  when to take this  reasons to take this   pantoprazole 40 MG tablet Commonly known as:  PROTONIX Take 1 tablet (40 mg total) by mouth daily.   polyethylene glycol packet Commonly known as:  MIRALAX / GLYCOLAX Take 17 g by mouth daily.    simvastatin 20 MG tablet Commonly known as:  ZOCOR Take 20 mg by mouth at bedtime.   tamsulosin 0.4 MG Caps capsule Commonly known as:  FLOMAX Take 0.4 mg by mouth daily after supper.   trolamine salicylate 10 % cream Commonly known as:  ASPERCREME Apply 1 application topically as needed for muscle pain (FOR HANDS AND RIGHT SHOULDER).      DISCHARGE INSTRUCTIONS:   DIET:  Regular diet DISCHARGE CONDITION:  Good ACTIVITY:  Activity as tolerated OXYGEN:  Home Oxygen: No.  Oxygen Delivery: room air DISCHARGE LOCATION:  group home - [facility]  If you experience worsening of your admission symptoms, develop shortness of breath, life threatening emergency, suicidal or homicidal thoughts you must seek medical attention immediately by calling 911 or calling your MD immediately  if symptoms less severe.  You Must read complete instructions/literature along with all the possible adverse reactions/side effects for all the Medicines you take and that have been prescribed to you. Take any new Medicines after you have completely understood and accpet all the possible adverse reactions/side effects.   Please note  You were cared for by a hospitalist during your hospital stay. If you have any questions about your discharge medications or the care you received while you were in the hospital after you are discharged, you can call the unit and asked to speak with the hospitalist on call if the hospitalist that took care of you is not available. Once you are discharged, your primary care physician will handle any further medical issues. Please note that NO REFILLS for any discharge medications will be authorized once you are discharged, as it is imperative that you return to your primary care physician (or establish a relationship with a primary care physician if you do not have one) for your aftercare needs so that they can reassess your need for medications and monitor your lab values.    On  the day of Discharge:  VITAL SIGNS:  Blood pressure (!) 165/86, pulse (!) 57, temperature 97.8 F (36.6 C), temperature source Oral, resp. rate 18, height 5\' 7"  (1.702 m), weight 83.5 kg (184 lb), SpO2 99 %. PHYSICAL EXAMINATION:  GENERAL:  72 y.o.-year-old patient lying in the bed with no acute distress.  EYES: Pupils equal, round, reactive to light and accommodation. No scleral icterus. Extraocular muscles intact.  HEENT: Head atraumatic, normocephalic. Oropharynx and nasopharynx clear.  NECK:  Supple, no jugular venous distention. No thyroid enlargement, no tenderness.  LUNGS: Normal breath sounds bilaterally, no wheezing, rales,rhonchi or crepitation. No use of accessory muscles of respiration.  CARDIOVASCULAR: S1, S2 normal. No murmurs, rubs, or gallops.  ABDOMEN: Soft, non-tender, non-distended. Bowel sounds present. No organomegaly or mass.  EXTREMITIES: No pedal edema, cyanosis, or clubbing.  NEUROLOGIC: Cranial nerves II through XII are intact.  Sensation intact. Gait not checked. Residual deficits from cerebal palsy PSYCHIATRIC: The patient is alert and oriented x 3.  SKIN: No obvious rash, lesion, or ulcer.  DATA REVIEW:   CBC Recent Labs  Lab 06/30/17 1711  07/01/17 1053  WBC 4.2  --   --   HGB 7.4*   < > 12.3*  HCT 22.3*   < > 36.4*  PLT 185  --   --    < > = values in this interval not displayed.    Chemistries  Recent Labs  Lab 07/01/17 0424  NA 139  K 4.0  CL 110  CO2 26  GLUCOSE 87  BUN 14  CREATININE 0.83  CALCIUM 8.7*     Follow-up Information    Jonathon Bellows, MD. Schedule an appointment as soon as possible for a visit in 1 week(s).   Specialty:  Gastroenterology Contact information: [address] [place] [address] [phone]            RADIOLOGY:  Dg Chest 2 View  Result Date: 06/30/2017 CLINICAL DATA:  Chest pain EXAM: CHEST - 2 VIEW COMPARISON:  None. FINDINGS: Low lung volumes. Mild cardiomegaly. No pneumothorax.  No pleural effusion. Degenerative changes of the spine and shoulders. IMPRESSION: Low lung volumes with mild cardiomegaly Electronically Signed   By: Donavan Foil M.D.   On: 06/30/2017 18:23     Management plans discussed with the patient, family (nieace over phone) and they are in agreement.  CODE STATUS: Full Code   TOTAL TIME TAKING CARE OF THIS PATIENT: 35 minutes.    [name] M.D on 07/01/2017 at 4:12 PM  Between 7am to 6pm - Pager - [phone]  After 6pm go to [email or web address] - Proofreader  [facility]  Hospitalists  Office  [phone]  CC: Primary care physician; Patient, No Pcp Per   Note: This dictation was prepared with Dragon dictation along with smaller phrase technology. Any transcriptional errors that result from this process are unintentional.

## 2017-07-01 NOTE — Care Management CC44 (Signed)
Condition Code 44 Documentation Completed  Patient Details  Name: Bob [name] MRN: [id number] Date of Birth: 03-Apr-1945   Condition Code 44 given:  Yes Patient signature on Condition Code 44 notice:  Yes Documentation of 2 MD's agreement:  Yes Code 44 added to claim:  Yes    Beverly Sessions, RN 07/01/2017, 5:09 PM

## 2017-07-01 NOTE — Clinical Social Work Note (Signed)
Patient to be d/c'ed today to [facility].  Patient and family agreeable to plans will transport via ems RN to call report to [place] [phone].  Evette Cristal, MSW, [place]

## 2017-07-02 LAB — TYPE AND SCREEN
ABO/RH(D): O POS
Antibody Screen: NEGATIVE
UNIT DIVISION: 0
Unit division: 0

## 2017-07-02 LAB — BPAM RBC
BLOOD PRODUCT EXPIRATION DATE: 201905222359
Blood Product Expiration Date: 201905222359
UNIT TYPE AND RH: 5100
Unit Type and Rh: 5100

## 2017-07-02 LAB — PREPARE RBC (CROSSMATCH)

## 2017-07-13 ENCOUNTER — Ambulatory Visit: Payer: Medicare Other | Admitting: Gastroenterology

## 2017-07-13 ENCOUNTER — Encounter: Payer: Self-pay | Admitting: *Deleted

## 2017-07-23 ENCOUNTER — Emergency Department
Admission: EM | Admit: 2017-07-23 | Discharge: 2017-07-23 | Disposition: A | Discharge status: Skilled Nursing Facility | Payer: Medicare Other | Attending: Emergency Medicine | Admitting: Emergency Medicine

## 2017-07-23 ENCOUNTER — Encounter: Payer: Self-pay | Admitting: Emergency Medicine

## 2017-07-23 DIAGNOSIS — H1013 Acute atopic conjunctivitis, bilateral: Secondary | ICD-10-CM | POA: Diagnosis not present

## 2017-07-23 DIAGNOSIS — Z87891 Personal history of nicotine dependence: Secondary | ICD-10-CM | POA: Diagnosis not present

## 2017-07-23 DIAGNOSIS — Z7982 Long term (current) use of aspirin: Secondary | ICD-10-CM | POA: Insufficient documentation

## 2017-07-23 DIAGNOSIS — Z79899 Other long term (current) drug therapy: Secondary | ICD-10-CM | POA: Insufficient documentation

## 2017-07-23 DIAGNOSIS — H53142 Visual discomfort, left eye: Secondary | ICD-10-CM | POA: Diagnosis present

## 2017-07-23 DIAGNOSIS — H53141 Visual discomfort, right eye: Secondary | ICD-10-CM | POA: Insufficient documentation

## 2017-07-23 MED ORDER — OLOPATADINE HCL 0.2 % OP SOLN: OPHTHALMIC | 0 refills | Status: DC

## 2017-07-23 MED ORDER — OLOPATADINE HCL 0.1 % OP SOLN: 1.0000 [drp] | Freq: Two times a day (BID) | OPHTHALMIC | Status: DC

## 2017-07-23 NOTE — Discharge Instructions (Addendum)
Begin using eyedrops 1 drop to each eye daily for allergy symptoms.  Continue with your regular medication.  Follow-up with [facility] if any continued problems.

## 2017-07-23 NOTE — ED Provider Notes (Signed)
[facility] Emergency Department Provider Note   ____________________________________________   First MD Initiated Contact with Patient 07/23/17 [phone]     (approximate)  I have reviewed the triage vital signs and the nursing notes.   HISTORY  Chief Complaint Eye Problem  HPI Bob [name] is a 72 y.o. male arrives to the emergency department via EMS with complaint of bilateral eyes itching.  Patient states that he was on the phone talking with someone and got emotional and began crying.  After that his eyes have been itching.  He had watery drainage from his eyes but denies any thick yellow drainage or crusting at any time.  He denies any photophobia or sensation of foreign body.  Past Medical History:  Diagnosis Date  . Back pain   . Cerebral palsy ([place])   . DDD (degenerative disc disease), lumbar   . Depression   . Depression   . GERD (gastroesophageal reflux disease)     Patient Active Problem List   Diagnosis Date Noted  . GI bleed 06/30/2017    Past Surgical History:  Procedure Laterality Date  . TOTAL HIP ARTHROPLASTY      Prior to Admission medications   Medication Sig Start Date End Date Taking? Authorizing Provider  acetaminophen (TYLENOL) 325 MG tablet Take 650 mg by mouth every 6 (six) hours as needed for mild pain or fever.    [provider]  acetaminophen (TYLENOL) 500 MG tablet Take 500 mg by mouth 2 (two) times daily.    [provider]  ARIPiprazole (ABILIFY) 2 MG tablet Take 2 mg by mouth every Monday, Wednesday, and Friday.    [provider]  aspirin EC 81 MG tablet Take 81 mg by mouth daily.    [provider]  bisacodyl (DULCOLAX) 5 MG EC tablet Take 5 mg by mouth at bedtime.    [provider]  cetirizine (ZYRTEC) 10 MG tablet Take 10 mg by mouth daily.    [provider]  Cholecalciferol (D3-1000 PO) Take 1,000 Units by mouth daily.    [provider]    docusate sodium (COLACE) 100 MG capsule Take 100 mg by mouth daily.     [provider]  DULoxetine (CYMBALTA) 60 MG capsule Take 60 mg by mouth daily.    [provider]  gabapentin (NEURONTIN) 600 MG tablet Take 600 mg by mouth at bedtime.    [provider]  levothyroxine (SYNTHROID, LEVOTHROID) 100 MCG tablet Take 100 mcg by mouth daily before breakfast.    [provider]  lithium carbonate 300 MG capsule Take 300 mg by mouth 2 (two) times daily with a meal.    [provider]  Melatonin 3 MG TABS Take 3 mg by mouth at bedtime.    [provider]  memantine (NAMENDA XR) 28 MG CP24 24 hr capsule Take 28 mg by mouth daily.    [provider]  Menthol (ICY HOT BACK) 5 % PTCH Apply 1 patch topically daily.    [provider]  modafinil (PROVIGIL) 200 MG tablet Take 200 mg by mouth every Sunday.    [provider]  morphine (MS CONTIN) 15 MG 12 hr tablet Take 1 tablet (15 mg total) by mouth every 12 (twelve) hours. 11/24/16   Earleen Newport, MD  Multiple Vitamins-Minerals (CERTAVITE SENIOR/ANTIOXIDANT PO) Take 1 tablet by mouth daily.    [provider]  Olopatadine HCl 0.2 % SOLN 1 drop each eye once daily  07/23/17   [name], PA-C  oxyCODONE-acetaminophen (PERCOCET) 5-325 MG tablet Take 2 tablets by mouth every 6 (six) hours as needed for moderate pain or severe pain. Patient taking differently: Take 1 tablet by mouth every 4 (four) hours as needed (breakthrough pain).  08/12/15   Earleen Newport, MD  pantoprazole (PROTONIX) 40 MG tablet Take 1 tablet (40 mg total) by mouth daily. 07/01/17   [name], MD  polyethylene glycol (MIRALAX / GLYCOLAX) packet Take 17 g by mouth daily.    [provider]  simvastatin (ZOCOR) 20 MG tablet Take 20 mg by mouth at bedtime.    [provider]  tamsulosin (FLOMAX) 0.4 MG CAPS capsule Take 0.4 mg by mouth daily after supper.     [provider]  trolamine salicylate (ASPERCREME) 10 % cream Apply 1 application topically as needed for muscle pain (FOR HANDS AND RIGHT SHOULDER).    [provider]    Allergies Patient has no known allergies.  History reviewed. No pertinent family history.  Social History Social History   Tobacco Use  . Smoking status: Former Research scientist (life sciences)  . Smokeless tobacco: Never Used  Substance Use Topics  . Alcohol use: No  . Drug use: No    Review of Systems Constitutional: No fever/chills Eyes: No visual changes.  Positive for itching. ENT: No sore throat. Cardiovascular: Denies chest pain. Respiratory: Denies shortness of breath. Skin: Negative for rash. Neurological: Negative for headaches ____________________________________________   PHYSICAL EXAM:  VITAL SIGNS: ED Triage Vitals  Enc Vitals Group     BP 07/23/17 0839 (!) 154/71     Pulse Rate 07/23/17 0839 (!) 58     Resp 07/23/17 0839 18     Temp 07/23/17 0839 98.1 F (36.7 C)     Temp Source 07/23/17 0839 Oral     SpO2 07/23/17 0839 98 %     Weight 07/23/17 0839 184 lb (83.5 kg)     Height 07/23/17 0839 5\' 7"  (1.702 m)     Head Circumference --      Peak Flow --      Pain Score 07/23/17 0857 0     Pain Loc --      Pain Edu? --      Excl. in [place]? --    Constitutional: Alert and oriented. Well appearing and in no acute distress.  Patient with history of cerebral palsy. Eyes: Conjunctivae are minimally irritated but no discharge present.  PERRL. EOMI. no foreign body. Head: Atraumatic. Neck: No stridor.   Cardiovascular: Normal rate, regular rhythm. Grossly normal heart sounds.  Good peripheral circulation. Respiratory: Normal respiratory effort.  No retractions. Lungs CTAB. Neurologic:   No gross focal neurologic deficits are appreciated.  Skin:  Skin is warm, dry and intact. No rash noted. Psychiatric: Mood and affect are normal. Speech and behavior are  normal.  ____________________________________________   LABS (all labs ordered are listed, but only abnormal results are displayed)  Labs Reviewed - No data to display   PROCEDURES  Procedure(s) performed: None  Procedures  Critical Care performed: No  ____________________________________________   INITIAL IMPRESSION / ASSESSMENT AND PLAN / ED COURSE  As part of my medical decision making, I reviewed the following data within the electronic MEDICAL RECORD NUMBER Notes from prior ED visits and [place] Controlled Substance Database  Patient here with complaint of bilateral eye itching.  Patient states he does have problems with allergies in the past.  He denies any discharge or matting  of his eyelashes.  Patient was given a prescription for Pataday to begin using daily.  He is to follow-up with [facility] if any continued problems.  Patient was returned back to the facility in stable condition.  ____________________________________________   FINAL CLINICAL IMPRESSION(S) / ED DIAGNOSES  Final diagnoses:  Allergic conjunctivitis of both eyes     ED Discharge Orders        Ordered    Olopatadine HCl 0.2 % SOLN     07/23/17 0915       Note:  This document was prepared using Dragon voice recognition software and may include unintentional dictation errors.    [name], PA-C 07/23/17 1221    Carrie Mew, MD 07/24/17 1539

## 2017-07-23 NOTE — ED Notes (Signed)
RN spoke with [facility] and pt to be transferred back by EMS.

## 2017-07-23 NOTE — ED Triage Notes (Signed)
Pt to ED by EMS from [facility] with c/o bilateral eye pain/itching.

## 2017-07-23 NOTE — ED Notes (Signed)
Pt verbalized understanding of discharge instructions. NAD at this time. 

## 2017-08-06 ENCOUNTER — Emergency Department: Payer: Medicare Other

## 2017-08-06 ENCOUNTER — Other Ambulatory Visit: Payer: Self-pay

## 2017-08-06 ENCOUNTER — Encounter: Payer: Self-pay | Admitting: Emergency Medicine

## 2017-08-06 ENCOUNTER — Emergency Department
Admission: EM | Admit: 2017-08-06 | Discharge: 2017-08-06 | Disposition: A | Discharge status: Home / Self Care | Payer: Medicare Other | Attending: Emergency Medicine | Admitting: Emergency Medicine

## 2017-08-06 DIAGNOSIS — R079 Chest pain, unspecified: Secondary | ICD-10-CM | POA: Diagnosis not present

## 2017-08-06 DIAGNOSIS — Z79899 Other long term (current) drug therapy: Secondary | ICD-10-CM | POA: Insufficient documentation

## 2017-08-06 DIAGNOSIS — Z7982 Long term (current) use of aspirin: Secondary | ICD-10-CM | POA: Diagnosis not present

## 2017-08-06 DIAGNOSIS — G809 Cerebral palsy, unspecified: Secondary | ICD-10-CM | POA: Diagnosis not present

## 2017-08-06 DIAGNOSIS — Z87891 Personal history of nicotine dependence: Secondary | ICD-10-CM | POA: Diagnosis not present

## 2017-08-06 LAB — BASIC METABOLIC PANEL
ANION GAP: 8 (ref 5–15)
BUN: 14 mg/dL (ref 6–20)
CHLORIDE: 107 mmol/L (ref 101–111)
CO2: 24 mmol/L (ref 22–32)
Calcium: 9 mg/dL (ref 8.9–10.3)
Creatinine, Ser: 0.78 mg/dL (ref 0.61–1.24)
GFR calc Af Amer: >60 mL/min (ref >60)
GFR calc non Af Amer: >60 mL/min (ref >60)
GLUCOSE: 104 mg/dL — AB (ref 65–99)
POTASSIUM: 4 mmol/L (ref 3.5–5.1)
Sodium: 139 mmol/L (ref 135–145)

## 2017-08-06 LAB — CBC
HEMATOCRIT: 34.2 % — AB (ref 40.0–52.0)
HEMOGLOBIN: 11.3 g/dL — AB (ref 13.0–18.0)
MCH: 28.7 pg (ref 26.0–34.0)
MCHC: 33.2 g/dL (ref 32.0–36.0)
MCV: 86.7 fL (ref 80.0–100.0)
Platelets: 294 10*3/uL (ref 150–440)
RBC: 3.94 MIL/uL — ABNORMAL LOW (ref 4.40–5.90)
RDW: 15.8 % — ABNORMAL HIGH (ref 11.5–14.5)
WBC: 6.6 10*3/uL (ref 3.8–10.6)

## 2017-08-06 LAB — TROPONIN I: Troponin I: <0.03 ng/mL (ref <0.03)

## 2017-08-06 NOTE — ED Notes (Signed)
Triage note:  Pt arrived via EMS from [facility] with reports of intermittent mid-sternal chest pain which he describes as sharp since last night.  Pt reported to EMS it may have been something he ate last night.  Pt is alert and oriented, states he is not in any pain right.  Pt has hx of Cerebral Palsy and depression.

## 2017-08-06 NOTE — ED Notes (Signed)
Pt DC back to [facility], already spoke with Ty to inform her the patient would be returning.

## 2017-08-06 NOTE — ED Provider Notes (Addendum)
[facility] Emergency Department Provider Note  Time seen: 8:43 AM  I have reviewed the triage vital signs and the nursing notes.   HISTORY  Chief Complaint Chest Pain    HPI Bob [name] is a 72 y.o. male with a past medical history of cerebral palsy, gastric reflux, presents to the emergency department for intermittent chest pain.  According to the patient since last night he has been experiencing intermittent chest pain.  Denies any chest pain currently.  Denies any shortness of breath nausea or diaphoresis at any point.  Largely negative review of systems.  Patient states the chest pain was brief intermittent and sharp in quality.   Past Medical History:  Diagnosis Date  . Back pain   . Cerebral palsy ([place])   . DDD (degenerative disc disease), lumbar   . Depression   . Depression   . GERD (gastroesophageal reflux disease)     Patient Active Problem List   Diagnosis Date Noted  . GI bleed 06/30/2017    Past Surgical History:  Procedure Laterality Date  . TOTAL HIP ARTHROPLASTY      Prior to Admission medications   Medication Sig Start Date End Date Taking? Authorizing Provider  acetaminophen (TYLENOL) 325 MG tablet Take 650 mg by mouth every 6 (six) hours as needed for mild pain or fever.    [provider]  acetaminophen (TYLENOL) 500 MG tablet Take 500 mg by mouth 2 (two) times daily.    [provider]  ARIPiprazole (ABILIFY) 2 MG tablet Take 2 mg by mouth every Monday, Wednesday, and Friday.    [provider]  aspirin EC 81 MG tablet Take 81 mg by mouth daily.    [provider]  bisacodyl (DULCOLAX) 5 MG EC tablet Take 5 mg by mouth at bedtime.    [provider]  cetirizine (ZYRTEC) 10 MG tablet Take 10 mg by mouth daily.    [provider]  Cholecalciferol (D3-1000 PO) Take 1,000 Units by mouth daily.    [provider]  docusate sodium (COLACE) 100 MG capsule Take 100 mg by  mouth daily.     [provider]  DULoxetine (CYMBALTA) 60 MG capsule Take 60 mg by mouth daily.    [provider]  gabapentin (NEURONTIN) 600 MG tablet Take 600 mg by mouth at bedtime.    [provider]  levothyroxine (SYNTHROID, LEVOTHROID) 100 MCG tablet Take 100 mcg by mouth daily before breakfast.    [provider]  lithium carbonate 300 MG capsule Take 300 mg by mouth 2 (two) times daily with a meal.    [provider]  Melatonin 3 MG TABS Take 3 mg by mouth at bedtime.    [provider]  memantine (NAMENDA XR) 28 MG CP24 24 hr capsule Take 28 mg by mouth daily.    [provider]  Menthol (ICY HOT BACK) 5 % PTCH Apply 1 patch topically daily.    [provider]  modafinil (PROVIGIL) 200 MG tablet Take 200 mg by mouth every Sunday.    [provider]  morphine (MS CONTIN) 15 MG 12 hr tablet Take 1 tablet (15 mg total) by mouth every 12 (twelve) hours. 11/24/16   Bob Newport, MD  Multiple Vitamins-Minerals (CERTAVITE SENIOR/ANTIOXIDANT PO) Take 1 tablet by mouth daily.    [provider]  Olopatadine HCl 0.2 % SOLN 1 drop each eye once daily 07/23/17   Bob Hai, PA-C  oxyCODONE-acetaminophen (  PERCOCET) 5-325 MG tablet Take 2 tablets by mouth every 6 (six) hours as needed for moderate pain or severe pain. Patient taking differently: Take 1 tablet by mouth every 4 (four) hours as needed (breakthrough pain).  08/12/15   Bob Newport, MD  pantoprazole (PROTONIX) 40 MG tablet Take 1 tablet (40 mg total) by mouth daily. 07/01/17   Bob Sane, MD  polyethylene glycol (MIRALAX / GLYCOLAX) packet Take 17 g by mouth daily.    [provider]  simvastatin (ZOCOR) 20 MG tablet Take 20 mg by mouth at bedtime.    [provider]  tamsulosin (FLOMAX) 0.4 MG CAPS capsule Take 0.4 mg by mouth daily after supper.    [provider]  trolamine salicylate (ASPERCREME) 10 %  cream Apply 1 application topically as needed for muscle pain (FOR HANDS AND RIGHT SHOULDER).    [provider]    No Known Allergies  No family history on file.  Social History Social History   Tobacco Use  . Smoking status: Former Research scientist (life sciences)  . Smokeless tobacco: Never Used  Substance Use Topics  . Alcohol use: No  . Drug use: No    Review of Systems Constitutional: Negative for fever. Eyes: Negative for visual complaints ENT: Negative for recent illness/congestion Cardiovascular: Intermittent chest pain since last night, gone now. Respiratory: Negative for shortness of breath. Gastrointestinal: Negative for abdominal pain, vomiting  Genitourinary: Negative for urinary compaints Musculoskeletal: Negative for leg pain or swelling Skin: Negative for skin complaints  Neurological: Negative for headache All other ROS negative  ____________________________________________   PHYSICAL EXAM:  Constitutional: Alert, lying in bed calmly, cooperative.  No current complaints. Eyes: Normal exam ENT   Head: Normocephalic and atraumatic.   Mouth/Throat: Mucous membranes are moist. Cardiovascular: Normal rate, regular rhythm.  Respiratory: Normal respiratory effort without tachypnea nor retractions. Breath sounds are clear Gastrointestinal: Soft and nontender. No distention.  Musculoskeletal: Nontender with normal range of motion in all extremities. No lower extremity tenderness or edema. Neurologic:  Normal speech and language. No gross focal neurologic deficits Skin:  Skin is warm, dry and intact.  Psychiatric: Mood and affect are normal.   ____________________________________________    EKG  EKG reviewed and interpreted by myself appears to show a sinus bradycardia at 49 bpm with a narrow QRS, normal axis, largely normal intervals otherwise, nonspecific ST changes.  ____________________________________________    RADIOLOGY  Chest x-ray shows stable  cardiomegaly.  ____________________________________________   INITIAL IMPRESSION / ASSESSMENT AND PLAN / ED COURSE  Pertinent labs & imaging results that were available during my care of the patient were reviewed by me and considered in my medical decision making (see chart for details).  Patient presents to the emergency department for intermittent chest pain since last night which continued overnight per patient.  Denies any chest pain currently.  Denies any shortness of breath nausea or diaphoresis at any point.  Differential would include ACS, chest wall pain, muscular skeletal pain, pneumonia, pneumothorax.  Patient denies any cough, congestion or fever.  We will check labs, EKG, chest x-ray and continue to closely monitor.  Labs are largely within normal limits.  X-ray is normal.  Patient continues to appear well denies any pain or symptoms in the emergency department.  Heart rate currently in the 50s.  ____________________________________________   FINAL CLINICAL IMPRESSION(S) / ED DIAGNOSES  Chest pain    Harvest Dark, MD 08/06/17 1003    Harvest Dark, MD 08/06/17 1005

## 2017-08-06 NOTE — ED Notes (Signed)
Pt given cup of water to drink. 

## 2018-01-02 ENCOUNTER — Emergency Department
Admission: EM | Admit: 2018-01-02 | Discharge: 2018-01-02 | Disposition: A | Discharge status: Home / Self Care | Payer: Medicare Other | Attending: Emergency Medicine | Admitting: Emergency Medicine

## 2018-01-02 ENCOUNTER — Emergency Department: Payer: Medicare Other

## 2018-01-02 DIAGNOSIS — G809 Cerebral palsy, unspecified: Secondary | ICD-10-CM | POA: Insufficient documentation

## 2018-01-02 DIAGNOSIS — T17308A Unspecified foreign body in larynx causing other injury, initial encounter: Secondary | ICD-10-CM | POA: Diagnosis not present

## 2018-01-02 DIAGNOSIS — Y92129 Unspecified place in nursing home as the place of occurrence of the external cause: Secondary | ICD-10-CM | POA: Diagnosis not present

## 2018-01-02 DIAGNOSIS — Z7982 Long term (current) use of aspirin: Secondary | ICD-10-CM | POA: Insufficient documentation

## 2018-01-02 DIAGNOSIS — Y999 Unspecified external cause status: Secondary | ICD-10-CM | POA: Diagnosis not present

## 2018-01-02 DIAGNOSIS — R0789 Other chest pain: Secondary | ICD-10-CM | POA: Insufficient documentation

## 2018-01-02 DIAGNOSIS — Z87891 Personal history of nicotine dependence: Secondary | ICD-10-CM | POA: Diagnosis not present

## 2018-01-02 DIAGNOSIS — X58XXXA Exposure to other specified factors, initial encounter: Secondary | ICD-10-CM | POA: Diagnosis not present

## 2018-01-02 DIAGNOSIS — Z79899 Other long term (current) drug therapy: Secondary | ICD-10-CM | POA: Diagnosis not present

## 2018-01-02 DIAGNOSIS — Y9389 Activity, other specified: Secondary | ICD-10-CM | POA: Diagnosis not present

## 2018-01-02 MED ORDER — ACETAMINOPHEN 500 MG PO TABS
1000.0000 mg | ORAL_TABLET | Freq: Once | ORAL | Status: AC
  Administered 2018-01-02: 1000 mg via ORAL
  Filled 2018-01-02: qty 2

## 2018-01-02 NOTE — ED Provider Notes (Signed)
[facility] Emergency Department Provider Note  ____________________________________________  Time seen: Approximately 7:22 PM  I have reviewed the triage vital signs and the nursing notes.   HISTORY  Chief Complaint No chief complaint on file.   HPI Bob [name] is a 72 y.o. male with a history of cerebral palsy and GERD who presents for evaluation of chest pain after Heimlich maneuver from a choking episode.  Patient was eating his dinner when he choked at the nursing home.  Staff performed the Heimlich maneuver.  Patient is complaining of pain that is located in his central lower chest, the pain is currently mild in intensity, sharp and non radiating.  Patient denies having any pain prior to the Heimlich maneuver.  He denies any shortness of breath.   Past Medical History:  Diagnosis Date  . Back pain   . Cerebral palsy ([place])   . DDD (degenerative disc disease), lumbar   . Depression   . Depression   . GERD (gastroesophageal reflux disease)     Patient Active Problem List   Diagnosis Date Noted  . GI bleed 06/30/2017    Past Surgical History:  Procedure Laterality Date  . TOTAL HIP ARTHROPLASTY      Prior to Admission medications   Medication Sig Start Date End Date Taking? Authorizing Provider  acetaminophen (TYLENOL) 325 MG tablet Take 650 mg by mouth every 6 (six) hours as needed for mild pain or fever.    [provider]  acetaminophen (TYLENOL) 500 MG tablet Take 500 mg by mouth 2 (two) times daily.    [provider]  ARIPiprazole (ABILIFY) 2 MG tablet Take 2 mg by mouth every Monday, Wednesday, and Friday.    [provider]  aspirin EC 81 MG tablet Take 81 mg by mouth daily.    [provider]  bisacodyl (DULCOLAX) 5 MG EC tablet Take 5 mg by mouth at bedtime.    [provider]  cetirizine (ZYRTEC) 10 MG tablet Take 10 mg by mouth daily.    [provider]  Cholecalciferol  (D3-1000 PO) Take 1,000 Units by mouth daily.    [provider]  docusate sodium (COLACE) 100 MG capsule Take 100 mg by mouth daily.     [provider]  DULoxetine (CYMBALTA) 60 MG capsule Take 60 mg by mouth daily.    [provider]  gabapentin (NEURONTIN) 600 MG tablet Take 600 mg by mouth at bedtime.    [provider]  levothyroxine (SYNTHROID, LEVOTHROID) 100 MCG tablet Take 100 mcg by mouth daily before breakfast.    [provider]  lithium carbonate 300 MG capsule Take 300 mg by mouth 2 (two) times daily with a meal.    [provider]  Melatonin 3 MG TABS Take 3 mg by mouth at bedtime.    [provider]  memantine (NAMENDA XR) 28 MG CP24 24 hr capsule Take 28 mg by mouth daily.    [provider]  Menthol (ICY HOT BACK) 5 % PTCH Apply 1 patch topically daily.    [provider]  modafinil (PROVIGIL) 200 MG tablet Take 200 mg by mouth every Sunday.    [provider]  morphine (MS CONTIN) 15 MG 12 hr tablet Take 1 tablet (15 mg total) by mouth every 12 (twelve) hours. 11/24/16   Earleen Newport, MD  Multiple Vitamins-Minerals (CERTAVITE SENIOR/ANTIOXIDANT PO) Take 1 tablet by mouth daily.    [provider]  Olopatadine  HCl 0.2 % SOLN 1 drop each eye once daily 07/23/17   [name], PA-C  oxyCODONE-acetaminophen (PERCOCET) 5-325 MG tablet Take 2 tablets by mouth every 6 (six) hours as needed for moderate pain or severe pain. Patient taking differently: Take 1 tablet by mouth every 4 (four) hours as needed (breakthrough pain).  08/12/15   Earleen Newport, MD  pantoprazole (PROTONIX) 40 MG tablet Take 1 tablet (40 mg total) by mouth daily. 07/01/17   [name], MD  polyethylene glycol (MIRALAX / GLYCOLAX) packet Take 17 g by mouth daily.    [provider]  simvastatin (ZOCOR) 20 MG tablet Take 20 mg by mouth at bedtime.    [provider]  tamsulosin  (FLOMAX) 0.4 MG CAPS capsule Take 0.4 mg by mouth daily after supper.    [provider]  trolamine salicylate (ASPERCREME) 10 % cream Apply 1 application topically as needed for muscle pain (FOR HANDS AND RIGHT SHOULDER).    [provider]    Allergies Patient has no known allergies.  History reviewed. No pertinent family history.  Social History Social History   Tobacco Use  . Smoking status: Former Research scientist (life sciences)  . Smokeless tobacco: Never Used  Substance Use Topics  . Alcohol use: No  . Drug use: No    Review of Systems  Constitutional: Negative for fever. Eyes: Negative for visual changes. ENT: Negative for sore throat. Neck: No neck pain  Cardiovascular: + chest pain. Respiratory: Negative for shortness of breath. + choking Gastrointestinal: Negative for abdominal pain, vomiting or diarrhea. Genitourinary: Negative for dysuria. Musculoskeletal: Negative for back pain. Skin: Negative for rash. Neurological: Negative for headaches, weakness or numbness. Psych: No SI or HI  ____________________________________________   PHYSICAL EXAM:  VITAL SIGNS: ED Triage Vitals  Enc Vitals Group     BP 01/02/18 1918 135/77     Pulse Rate 01/02/18 1918 (!) 58     Resp 01/02/18 1918 (!) 29     Temp 01/02/18 1918 99.2 F (37.3 C)     Temp Source 01/02/18 1918 Oral     SpO2 01/02/18 1918 94 %     Weight 01/02/18 1919 168 lb (76.2 kg)     Height 01/02/18 1919 5\' 7"  (1.702 m)     Head Circumference --      Peak Flow --      Pain Score 01/02/18 1919 3     Pain Loc --      Pain Edu? --      Excl. in [place]? --     Constitutional: Alert and oriented, no distress.  HEENT:      Head: Normocephalic and atraumatic.         Eyes: Conjunctivae are normal. Sclera is non-icteric.       Mouth/Throat: Mucous membranes are moist.       Neck: Supple with no signs of meningismus. Cardiovascular: Regular rate and rhythm. No murmurs, gallops, or rubs. 2+ symmetrical distal  pulses are present in all extremities. No JVD.  Palpation of patient's central lower chest reproduces the pain Respiratory: Normal respiratory effort. Lungs are clear to auscultation bilaterally. No wheezes, crackles, or rhonchi.  Gastrointestinal: Soft, non tender, and non distended with positive bowel sounds. No rebound or guarding. Musculoskeletal: Nontender with normal range of motion in all extremities. No edema, cyanosis, or erythema of extremities. Skin: Skin is warm, dry and intact. No rash noted. Psychiatric: Mood and affect are normal. Speech and behavior are normal.  ____________________________________________  LABS (all labs ordered are listed, but only abnormal results are displayed)  Labs Reviewed - No data to display ____________________________________________  EKG  ED ECG REPORT I, Rudene Re, the attending physician, personally viewed and interpreted this ECG.   Sinus bradycardia, rate of 54, right bundle branch block, normal QTC, no ST elevations or depressions.  EKG is unchanged from prior. ____________________________________________  RADIOLOGY  I have personally reviewed the images performed during this visit and I agree with the Radiologist's read.   Interpretation by Radiologist:  Dg Chest 2 View  Result Date: 01/02/2018 CLINICAL DATA:  Pt arrived from [facility] with complaints of choking on his dinner, the Heimlich maneuver was performed and now pt has complaints of chest pain as a result of that. Pt is alert and oriented x 4. EXAM: CHEST - 2 VIEW COMPARISON:  08/06/2017 FINDINGS: The heart is mildly enlarged. Aorta is mildly tortuous. There are no focal consolidations or pleural effusions. No pulmonary edema. No pneumothorax or acute displaced rib fractures. Chronic changes in both shoulders. Degenerative changes are seen in thoracic spine. IMPRESSION: 1. Stable cardiomegaly. 2.  No evidence for acute cardiopulmonary abnormality. Electronically  Signed   By: Nolon Nations M.D.   On: 01/02/2018 19:58     ____________________________________________   PROCEDURES  Procedure(s) performed: None Procedures Critical Care performed:  None ____________________________________________   INITIAL IMPRESSION / ASSESSMENT AND PLAN / ED COURSE  72 y.o. male with a history of cerebral palsy and GERD who presents for evaluation of chest pain after Heimlich maneuver from a choking episode.  Patient had no pain prior to episode and pain is reproducible on palpation.  Most likely sustained during Heimlich maneuver.  We will do a chest x-ray to rule out aspiration.  There is no hypoxia and lungs are clear to auscultation.  Will give Tylenol for the pain.  Will check an EKG.    _________________________ 8:05 PM on 01/02/2018 -----------------------------------------  Chest x-ray with no infiltrate.  EKG unchanged from baseline.  Pain has improved with Tylenol.  Patient be discharged back to his nursing home with close follow-up with primary care doctor.  Discussed return precautions for new or worsening chest pain, shortness of breath or fever.   As part of my medical decision making, I reviewed the following data within the [place] notes reviewed and incorporated, EKG interpreted , Old EKG reviewed, Old chart reviewed, Radiograph reviewed , Notes from prior ED visits and [place] Controlled Substance Database    Pertinent labs & imaging results that were available during my care of the patient were reviewed by me and considered in my medical decision making (see chart for details).    ____________________________________________   FINAL CLINICAL IMPRESSION(S) / ED DIAGNOSES  Final diagnoses:  Choking, initial encounter      NEW MEDICATIONS STARTED DURING THIS VISIT:  ED Discharge Orders    None       Note:  This document was prepared using Dragon voice recognition software and may include unintentional  dictation errors.    Rudene Re, MD 01/02/18 2007

## 2018-01-02 NOTE — Discharge Instructions (Addendum)
Return to the emergency room for shortness of breath, worsening chest pain or fever.  Otherwise follow-up with primary care doctor in 2 days.

## 2018-01-02 NOTE — ED Triage Notes (Signed)
Pt arrived from [facility] with complaints of choking on his dinner, the Heimlich maneuver was performed and now pt has complaints of chest pain as a result of that. Pt is alert and oriented x 4. EMS stated that the pt "possibly aspirated a little" while choking. VS per EMS BP-146/74 O2sat-98%RA NSR on monitor. EMS placed a 20 in left forearm. EMS gave 4mg  of Zofran. Pt paperwork is at the bedside.

## 2018-04-05 ENCOUNTER — Emergency Department: Payer: Medicare Other

## 2018-04-05 ENCOUNTER — Emergency Department
Admission: EM | Admit: 2018-04-05 | Discharge: 2018-04-05 | Disposition: A | Discharge status: Home / Self Care | Payer: Medicare Other | Attending: Emergency Medicine | Admitting: Emergency Medicine

## 2018-04-05 ENCOUNTER — Other Ambulatory Visit: Payer: Self-pay

## 2018-04-05 DIAGNOSIS — G809 Cerebral palsy, unspecified: Secondary | ICD-10-CM | POA: Diagnosis not present

## 2018-04-05 DIAGNOSIS — Y9389 Activity, other specified: Secondary | ICD-10-CM | POA: Diagnosis not present

## 2018-04-05 DIAGNOSIS — Y929 Unspecified place or not applicable: Secondary | ICD-10-CM | POA: Diagnosis not present

## 2018-04-05 DIAGNOSIS — Z96649 Presence of unspecified artificial hip joint: Secondary | ICD-10-CM | POA: Insufficient documentation

## 2018-04-05 DIAGNOSIS — Z87891 Personal history of nicotine dependence: Secondary | ICD-10-CM | POA: Insufficient documentation

## 2018-04-05 DIAGNOSIS — F329 Major depressive disorder, single episode, unspecified: Secondary | ICD-10-CM | POA: Insufficient documentation

## 2018-04-05 DIAGNOSIS — Z859 Personal history of malignant neoplasm, unspecified: Secondary | ICD-10-CM | POA: Diagnosis not present

## 2018-04-05 DIAGNOSIS — W19XXXA Unspecified fall, initial encounter: Secondary | ICD-10-CM

## 2018-04-05 DIAGNOSIS — W010XXA Fall on same level from slipping, tripping and stumbling without subsequent striking against object, initial encounter: Secondary | ICD-10-CM | POA: Insufficient documentation

## 2018-04-05 DIAGNOSIS — S3992XA Unspecified injury of lower back, initial encounter: Secondary | ICD-10-CM | POA: Diagnosis present

## 2018-04-05 DIAGNOSIS — S5001XA Contusion of right elbow, initial encounter: Secondary | ICD-10-CM | POA: Diagnosis not present

## 2018-04-05 DIAGNOSIS — S8002XA Contusion of left knee, initial encounter: Secondary | ICD-10-CM | POA: Insufficient documentation

## 2018-04-05 DIAGNOSIS — T148XXA Other injury of unspecified body region, initial encounter: Secondary | ICD-10-CM

## 2018-04-05 DIAGNOSIS — Y998 Other external cause status: Secondary | ICD-10-CM | POA: Diagnosis not present

## 2018-04-05 DIAGNOSIS — S39012A Strain of muscle, fascia and tendon of lower back, initial encounter: Secondary | ICD-10-CM | POA: Insufficient documentation

## 2018-04-05 HISTORY — DX: Malignant (primary) neoplasm, unspecified: C80.1

## 2018-04-05 MED ORDER — HYDROCODONE-ACETAMINOPHEN 5-325 MG PO TABS
2.0000 | ORAL_TABLET | Freq: Once | ORAL | Status: AC
  Administered 2018-04-05: 2 via ORAL
  Filled 2018-04-05: qty 2

## 2018-04-05 NOTE — ED Provider Notes (Signed)
[facility] Emergency Department Provider Note       Time seen: ----------------------------------------- 2:24 PM on 04/05/2018 -----------------------------------------   I have reviewed the triage vital signs and the nursing notes.  HISTORY   Chief Complaint Fall    HPI Bob [name] is a 73 y.o. male with a history of back pain, cancer, cerebral palsy, degenerative disc disease, depression, GERD who presents to the ED for unwitnessed mechanical fall.  Patient states his foot got stuck on a table leg.  He then fell.  He is complaining of pain to his right elbow, left knee and low back.  Patient states he did not hit his head or lose consciousness.  Past Medical History:  Diagnosis Date  . Back pain   . Cancer ([place])   . Cerebral palsy ([place])   . DDD (degenerative disc disease), lumbar   . Depression   . Depression   . GERD (gastroesophageal reflux disease)     Patient Active Problem List   Diagnosis Date Noted  . GI bleed 06/30/2017    Past Surgical History:  Procedure Laterality Date  . TOTAL HIP ARTHROPLASTY      Allergies Patient has no known allergies.  Social History Social History   Tobacco Use  . Smoking status: Former Research scientist (life sciences)  . Smokeless tobacco: Never Used  Substance Use Topics  . Alcohol use: No  . Drug use: No   Review of Systems Constitutional: Negative for fever. Cardiovascular: Negative for chest pain. Respiratory: Negative for shortness of breath. Gastrointestinal: Negative for abdominal pain, vomiting and diarrhea. Musculoskeletal: Positive for right elbow, left knee and low back pain Skin: Negative for rash. Neurological: Negative for headaches, focal weakness or numbness.  All systems negative/normal/unremarkable except as stated in the HPI  ____________________________________________   PHYSICAL EXAM:  VITAL SIGNS: ED Triage Vitals  Enc Vitals Group     BP 04/05/18 1413 (!) 149/71     Pulse Rate  04/05/18 1420 (!) 53     Resp 04/05/18 1420 12     Temp 04/05/18 1420 97.8 F (36.6 C)     Temp Source 04/05/18 1420 Oral     SpO2 04/05/18 1413 99 %     Weight 04/05/18 1421 167 lb (75.8 kg)     Height 04/05/18 1421 5\' 7"  (1.702 m)     Head Circumference --      Peak Flow --      Pain Score 04/05/18 1420 5     Pain Loc --      Pain Edu? --      Excl. in [place]? --    Constitutional: Alert and oriented. Well appearing and in no distress. Cardiovascular: Normal rate, regular rhythm. No murmurs, rubs, or gallops. Respiratory: Normal respiratory effort without tachypnea nor retractions. Breath sounds are clear and equal bilaterally. No wheezes/rales/rhonchi. Gastrointestinal: Soft and nontender. Normal bowel sounds Musculoskeletal: Mild pain with range of motion of the right elbow, left knee. Neurologic: Speech appears to be at his baseline. No gross focal neurologic deficits are appreciated.  Skin:  Skin is warm, dry and intact. No rash noted. Psychiatric: Mood and affect are normal. Speech and behavior are normal.   ____________________________________________  ED COURSE:  As part of my medical decision making, I reviewed the following data within the [place] History obtained from family if available, nursing notes, old chart and ekg, as well as notes from prior ED visits. Patient presented for unwitnessed fall, we will assess with imaging  as indicated at this time.   Procedures ____________________________________________   RADIOLOGY Images were viewed by me  Lumbar spine x-ray, knee x-ray, elbow x-ray Are unremarkable ____________________________________________   DIFFERENTIAL DIAGNOSIS   Fall, contusion, abrasion, fracture  FINAL ASSESSMENT AND PLAN  Fall, elbow contusion, knee contusion, lumbosacral strain   Plan: The patient had presented for mechanical fall. Patient's imaging not reveal any acute process.  He is cleared for outpatient  follow-up.   Laurence Aly, MD    Note: This note was generated in part or whole with voice recognition software. Voice recognition is usually quite accurate but there are transcription errors that can and very often do occur. I apologize for any typographical errors that were not detected and corrected.     Earleen Newport, MD 04/05/18 [phone]

## 2018-04-05 NOTE — ED Triage Notes (Signed)
Pt arrives via ems from [facility] with report of unwitnessed mechanical fall. Pt a&o x 4 on arrival. EM reports pt fell after 1330, denies LOC. States pt caught foot on nightstand. Pt complains of right elbow, left knee, and lower back. Ems states pt speech and posture is at pt baseline.

## 2018-04-05 NOTE — ED Notes (Signed)
Pt was discharged at 1555. Unable to document at that time due to down time with epic

## 2018-05-01 ENCOUNTER — Encounter: Payer: Self-pay | Admitting: *Deleted

## 2018-05-01 ENCOUNTER — Inpatient Hospital Stay
Admission: EM | Admit: 2018-05-01 | Discharge: 2018-05-09 | DRG: 871 | Disposition: A | Discharge status: Hospice | Payer: Medicare Other | Source: Skilled Nursing Facility | Attending: Internal Medicine | Admitting: Internal Medicine

## 2018-05-01 ENCOUNTER — Other Ambulatory Visit: Payer: Self-pay

## 2018-05-01 ENCOUNTER — Emergency Department: Payer: Medicare Other

## 2018-05-01 DIAGNOSIS — C160 Malignant neoplasm of cardia: Secondary | ICD-10-CM | POA: Diagnosis present

## 2018-05-01 DIAGNOSIS — Z7982 Long term (current) use of aspirin: Secondary | ICD-10-CM | POA: Diagnosis not present

## 2018-05-01 DIAGNOSIS — R531 Weakness: Secondary | ICD-10-CM | POA: Diagnosis not present

## 2018-05-01 DIAGNOSIS — D61810 Antineoplastic chemotherapy induced pancytopenia: Secondary | ICD-10-CM | POA: Diagnosis present

## 2018-05-01 DIAGNOSIS — E860 Dehydration: Secondary | ICD-10-CM | POA: Diagnosis present

## 2018-05-01 DIAGNOSIS — R1319 Other dysphagia: Secondary | ICD-10-CM

## 2018-05-01 DIAGNOSIS — K311 Adult hypertrophic pyloric stenosis: Secondary | ICD-10-CM | POA: Diagnosis present

## 2018-05-01 DIAGNOSIS — K210 Gastro-esophageal reflux disease with esophagitis: Secondary | ICD-10-CM | POA: Diagnosis not present

## 2018-05-01 DIAGNOSIS — R6520 Severe sepsis without septic shock: Secondary | ICD-10-CM | POA: Diagnosis present

## 2018-05-01 DIAGNOSIS — R4182 Altered mental status, unspecified: Secondary | ICD-10-CM

## 2018-05-01 DIAGNOSIS — R1310 Dysphagia, unspecified: Secondary | ICD-10-CM

## 2018-05-01 DIAGNOSIS — J189 Pneumonia, unspecified organism: Secondary | ICD-10-CM | POA: Diagnosis present

## 2018-05-01 DIAGNOSIS — D61818 Other pancytopenia: Secondary | ICD-10-CM | POA: Diagnosis not present

## 2018-05-01 DIAGNOSIS — Z66 Do not resuscitate: Secondary | ICD-10-CM | POA: Diagnosis present

## 2018-05-01 DIAGNOSIS — Z79891 Long term (current) use of opiate analgesic: Secondary | ICD-10-CM

## 2018-05-01 DIAGNOSIS — A419 Sepsis, unspecified organism: Secondary | ICD-10-CM | POA: Diagnosis present

## 2018-05-01 DIAGNOSIS — G809 Cerebral palsy, unspecified: Secondary | ICD-10-CM | POA: Diagnosis present

## 2018-05-01 DIAGNOSIS — Z7189 Other specified counseling: Secondary | ICD-10-CM | POA: Diagnosis not present

## 2018-05-01 DIAGNOSIS — Z79899 Other long term (current) drug therapy: Secondary | ICD-10-CM | POA: Diagnosis not present

## 2018-05-01 DIAGNOSIS — Z515 Encounter for palliative care: Secondary | ICD-10-CM | POA: Diagnosis not present

## 2018-05-01 DIAGNOSIS — E870 Hyperosmolality and hypernatremia: Secondary | ICD-10-CM | POA: Diagnosis present

## 2018-05-01 DIAGNOSIS — C159 Malignant neoplasm of esophagus, unspecified: Secondary | ICD-10-CM | POA: Diagnosis present

## 2018-05-01 DIAGNOSIS — Z79890 Hormone replacement therapy (postmenopausal): Secondary | ICD-10-CM

## 2018-05-01 DIAGNOSIS — T451X5A Adverse effect of antineoplastic and immunosuppressive drugs, initial encounter: Secondary | ICD-10-CM | POA: Diagnosis present

## 2018-05-01 DIAGNOSIS — J690 Pneumonitis due to inhalation of food and vomit: Secondary | ICD-10-CM | POA: Diagnosis present

## 2018-05-01 DIAGNOSIS — R1314 Dysphagia, pharyngoesophageal phase: Secondary | ICD-10-CM | POA: Diagnosis present

## 2018-05-01 DIAGNOSIS — R0902 Hypoxemia: Secondary | ICD-10-CM | POA: Diagnosis present

## 2018-05-01 DIAGNOSIS — Z87891 Personal history of nicotine dependence: Secondary | ICD-10-CM

## 2018-05-01 DIAGNOSIS — F329 Major depressive disorder, single episode, unspecified: Secondary | ICD-10-CM | POA: Diagnosis present

## 2018-05-01 DIAGNOSIS — N179 Acute kidney failure, unspecified: Secondary | ICD-10-CM | POA: Diagnosis present

## 2018-05-01 DIAGNOSIS — K219 Gastro-esophageal reflux disease without esophagitis: Secondary | ICD-10-CM | POA: Diagnosis present

## 2018-05-01 DIAGNOSIS — Z96649 Presence of unspecified artificial hip joint: Secondary | ICD-10-CM | POA: Diagnosis present

## 2018-05-01 DIAGNOSIS — E43 Unspecified severe protein-calorie malnutrition: Secondary | ICD-10-CM | POA: Diagnosis present

## 2018-05-01 DIAGNOSIS — C155 Malignant neoplasm of lower third of esophagus: Secondary | ICD-10-CM | POA: Diagnosis not present

## 2018-05-01 HISTORY — DX: Other intervertebral disc degeneration, lumbar region: M51.36

## 2018-05-01 HISTORY — DX: Other intervertebral disc degeneration, lumbar region without mention of lumbar back pain or lower extremity pain: M51.369

## 2018-05-01 HISTORY — DX: Unspecified mental disorder due to known physiological condition: F09

## 2018-05-01 LAB — TROPONIN I: Troponin I: 0.07 ng/mL (ref <0.03)

## 2018-05-01 LAB — COMPREHENSIVE METABOLIC PANEL
ALT: 25 U/L (ref 0–44)
AST: 58 U/L — ABNORMAL HIGH (ref 15–41)
Albumin: 3.1 g/dL — ABNORMAL LOW (ref 3.5–5.0)
Alkaline Phosphatase: 55 U/L (ref 38–126)
Anion gap: 9 (ref 5–15)
BUN: 40 mg/dL — ABNORMAL HIGH (ref 8–23)
CO2: 28 mmol/L (ref 22–32)
Calcium: 9.4 mg/dL (ref 8.9–10.3)
Chloride: 115 mmol/L — ABNORMAL HIGH (ref 98–111)
Creatinine, Ser: 1.89 mg/dL — ABNORMAL HIGH (ref 0.61–1.24)
GFR calc non Af Amer: 35 mL/min — ABNORMAL LOW (ref >60)
GFR, EST AFRICAN AMERICAN: 40 mL/min — AB (ref >60)
Glucose, Bld: 116 mg/dL — ABNORMAL HIGH (ref 70–99)
Potassium: 4.4 mmol/L (ref 3.5–5.1)
SODIUM: 152 mmol/L — AB (ref 135–145)
Total Bilirubin: 0.5 mg/dL (ref 0.3–1.2)
Total Protein: 6.9 g/dL (ref 6.5–8.1)

## 2018-05-01 LAB — CBC
HCT: 33.3 % — ABNORMAL LOW (ref 39.0–52.0)
Hemoglobin: 9.7 g/dL — ABNORMAL LOW (ref 13.0–17.0)
MCH: 26 pg (ref 26.0–34.0)
MCHC: 29.1 g/dL — ABNORMAL LOW (ref 30.0–36.0)
MCV: 89.3 fL (ref 80.0–100.0)
Platelets: 83 10*3/uL — ABNORMAL LOW (ref 150–400)
RBC: 3.73 MIL/uL — ABNORMAL LOW (ref 4.22–5.81)
RDW: 19.9 % — ABNORMAL HIGH (ref 11.5–15.5)
WBC: 0.6 10*3/uL — CL (ref 4.0–10.5)
nRBC: 0 % (ref 0.0–0.2)

## 2018-05-01 LAB — URINALYSIS, COMPLETE (UACMP) WITH MICROSCOPIC
Bacteria, UA: NONE SEEN
Bilirubin Urine: NEGATIVE
Glucose, UA: NEGATIVE mg/dL
Ketones, ur: NEGATIVE mg/dL
Leukocytes,Ua: NEGATIVE
Nitrite: NEGATIVE
PH: 5 (ref 5.0–8.0)
Protein, ur: 30 mg/dL — AB
Specific Gravity, Urine: 1.016 (ref 1.005–1.030)

## 2018-05-01 LAB — LACTIC ACID, PLASMA: Lactic Acid, Venous: 2 mmol/L (ref 0.5–1.9)

## 2018-05-01 MED ORDER — SODIUM CHLORIDE 0.9 % IV SOLN
500.0000 mg | Freq: Once | INTRAVENOUS | Status: AC
  Administered 2018-05-02: 500 mg via INTRAVENOUS
  Filled 2018-05-01: qty 500

## 2018-05-01 MED ORDER — SODIUM CHLORIDE 0.9 % IV BOLUS
1000.0000 mL | Freq: Once | INTRAVENOUS | Status: AC
  Administered 2018-05-01: 1000 mL via INTRAVENOUS

## 2018-05-01 MED ORDER — PANTOPRAZOLE SODIUM 40 MG IV SOLR
40.0000 mg | INTRAVENOUS | Status: DC
  Administered 2018-05-02 – 2018-05-09 (×8): 40 mg via INTRAVENOUS
  Filled 2018-05-01 (×8): qty 40

## 2018-05-01 MED ORDER — IOHEXOL 350 MG/ML SOLN
60.0000 mL | Freq: Once | INTRAVENOUS | Status: AC | PRN
  Administered 2018-05-01: 60 mL via INTRAVENOUS

## 2018-05-01 MED ORDER — SODIUM CHLORIDE 0.9 % IV SOLN
1.0000 g | Freq: Once | INTRAVENOUS | Status: AC
  Administered 2018-05-01: 1 g via INTRAVENOUS
  Filled 2018-05-01: qty 10

## 2018-05-01 NOTE — ED Triage Notes (Addendum)
Pt to ED from [facility] after witnessed syncopal episode after ambulating to the bathroom. Altered mental status per staff. 86% on RA with Fire who placed pt on a [place] at 2L that elevated pt to the low 90s. Pt arrived to ED on a NRB. Per [facility] staff pt is normally able to ambulate around with walker and without difficulty. Throughout the week pt has been deteriorating.   EMS reporting multiple syncopal episodes in their presence.   98.2 F 135 CBG  174/50 101/74 after 100cc of fluids 127 HR

## 2018-05-01 NOTE — ED Notes (Signed)
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

## 2018-05-01 NOTE — H&P (Signed)
[name] [place] NAME: Bob [name]    MR#:  [id number]  DATE OF BIRTH:  May 16, 1945  DATE OF ADMISSION:  05/01/2018  PRIMARY CARE PHYSICIAN: Patient, No Pcp Per   REQUESTING/REFERRING PHYSICIAN: Paduchowski, MD  CHIEF COMPLAINT:   Chief Complaint  Patient presents with  . Shortness of Breath  . Altered Mental Status    HISTORY OF PRESENT ILLNESS:  Bob [name]  is a 73 y.o. male who presents with chief complaint as above.  Patient presents to the ED with a complaint of shortness of breath and weakness.  He states that he has been feeling bad for the last several days.  Evaluation here in the ED shows bilateral pneumonia.  Patient meets sepsis criteria.  He has a history of esophageal cancer and is on current chemotherapy and so is also pancytopenic.  Hospitalist were called for admission  PAST MEDICAL HISTORY:   Past Medical History:  Diagnosis Date  . Back pain   . Cancer ([place])   . Cerebral palsy ([place])   . DDD (degenerative disc disease), lumbar   . Depression   . Depression   . GERD (gastroesophageal reflux disease)   . Mild cognitive disorder   . Other intervertebral disc degeneration, lumbar region      PAST SURGICAL HISTORY:   Past Surgical History:  Procedure Laterality Date  . TOTAL HIP ARTHROPLASTY       SOCIAL HISTORY:   Social History   Tobacco Use  . Smoking status: Former Research scientist (life sciences)  . Smokeless tobacco: Never Used  Substance Use Topics  . Alcohol use: No     FAMILY HISTORY:    Family history reviewed and is non-contributory DRUG ALLERGIES:  No Known Allergies  MEDICATIONS AT HOME:   Prior to Admission medications   Medication Sig Start Date End Date Taking? Authorizing Provider  acetaminophen (TYLENOL) 500 MG tablet Take 500 mg by mouth 2 (two) times daily.   Yes [provider]  ARIPiprazole (ABILIFY) 2 MG tablet Take 2 mg by mouth every Monday, Wednesday, and Friday. At  bedtime.   Yes [provider]  aspirin EC 81 MG tablet Take 81 mg by mouth daily.   Yes [provider]  bisacodyl (DULCOLAX) 5 MG EC tablet Take 5 mg by mouth at bedtime.   Yes [provider]  cetirizine (ZYRTEC) 10 MG tablet Take 10 mg by mouth daily.   Yes [provider]  Cholecalciferol (D3-1000 PO) Take 1,000 Units by mouth daily.   Yes [provider]  docusate sodium (COLACE) 100 MG capsule Take 200 mg by mouth daily. At 6 AM   Yes [provider]  DULoxetine (CYMBALTA) 60 MG capsule Take 60 mg by mouth daily. At 6 AM   Yes [provider]  gabapentin (NEURONTIN) 600 MG tablet Take 600 mg by mouth at bedtime.   Yes [provider]  levothyroxine (SYNTHROID, LEVOTHROID) 100 MCG tablet Take 100 mcg by mouth daily before breakfast.   Yes [provider]  Melatonin 3 MG TABS Take 3 mg by mouth at bedtime. At 8 PM   Yes [provider]  memantine (NAMENDA XR) 28 MG CP24 24 hr capsule Take 28 mg by mouth daily.   Yes [provider]  Menthol (ICY HOT BACK) 5 % PTCH Apply 1 patch topically daily as needed. Remove at bedtime   Yes [provider]  modafinil (PROVIGIL) 200 MG tablet Take 200  mg by mouth daily.    Yes [provider]  morphine (MS CONTIN) 15 MG 12 hr tablet Take 1 tablet (15 mg total) by mouth every 12 (twelve) hours. 11/24/16  Yes Earleen Newport, MD  Multiple Vitamins-Minerals (CERTAVITE SENIOR/ANTIOXIDANT PO) Take 1 tablet by mouth daily. At 6 AM   Yes [provider]  pantoprazole (PROTONIX) 40 MG tablet Take 1 tablet (40 mg total) by mouth daily. Patient taking differently: Take 40 mg by mouth 2 (two) times daily.  07/01/17  Yes [name], MD  polyethylene glycol (MIRALAX / GLYCOLAX) packet Take 17 g by mouth every other day.    Yes [provider]  simvastatin (ZOCOR) 20 MG tablet Take 20 mg by mouth at bedtime.   Yes [provider]  tamsulosin (FLOMAX) 0.4 MG CAPS capsule Take 0.4 mg by mouth daily after supper.   Yes [provider]  trolamine salicylate (ASPERCREME) 10 % cream Apply 1 application topically 2 (two) times daily. To hands and right shoulder. Do not apply under [facility] patch.   Yes [provider]    REVIEW OF SYSTEMS:  Review of Systems  Constitutional: Positive for malaise/fatigue. Negative for chills, fever and weight loss.  HENT: Negative for ear pain, hearing loss and tinnitus.   Eyes: Negative for blurred vision, double vision, pain and redness.  Respiratory: Positive for shortness of breath. Negative for cough and hemoptysis.   Cardiovascular: Negative for chest pain, palpitations, orthopnea and leg swelling.  Gastrointestinal: Positive for nausea and vomiting. Negative for abdominal pain, constipation and diarrhea.  Genitourinary: Negative for dysuria, frequency and hematuria.  Musculoskeletal: Negative for back pain, joint pain and neck pain.  Skin:       No acne, rash, or lesions  Neurological: Negative for dizziness, tremors, focal weakness and weakness.  Endo/Heme/Allergies: Negative for polydipsia. Does not bruise/bleed easily.  Psychiatric/Behavioral: Negative for depression. The patient is not nervous/anxious and does not have insomnia.      VITAL SIGNS:   Vitals:   05/01/18 2048 05/01/18 2053 05/01/18 2100 05/01/18 2115  BP:  90/60 (!) 92/59   Pulse:  (!) 122 (!) 121 (!) 117  Resp:  20 (!) 24 (!) 26  Temp:  98.2 F (36.8 C)    TempSrc:  Oral    SpO2: (!) 83% 95%    Weight:      Height:       Wt Readings from Last 3 Encounters:  05/01/18 75.8 kg  04/05/18 75.8 kg  01/02/18 76.2 kg    PHYSICAL EXAMINATION:  Physical Exam  Vitals reviewed. Constitutional: He is oriented to person, place, and time. He appears well-developed and well-nourished. No distress.  HENT:  Head: Normocephalic and atraumatic.  Dry mucous membranes  Eyes: Pupils  are equal, round, and reactive to light. Conjunctivae and EOM are normal. No scleral icterus.  Neck: Normal range of motion. Neck supple. No JVD present. No thyromegaly present.  Cardiovascular: Normal rate, regular rhythm and intact distal pulses. Exam reveals no gallop and no friction rub.  No murmur heard. Respiratory: Effort normal. No respiratory distress. He has no wheezes. He has no rales.  Bibasilar ronchi  GI: Soft. Bowel sounds are normal. He exhibits no distension. There is no abdominal tenderness.  Musculoskeletal: Normal range of motion.        General: No edema.     Comments: No arthritis, no gout  Lymphadenopathy:    He has no cervical adenopathy.  Neurological: He  is alert and oriented to person, place, and time. No cranial nerve deficit.  No dysarthria, no aphasia  Skin: Skin is warm and dry. No rash noted. No erythema.  Psychiatric: He has a normal mood and affect. His behavior is normal. Judgment and thought content normal.    LABORATORY PANEL:   CBC Recent Labs  Lab 05/01/18 2051  WBC 0.6*  HGB 9.7*  HCT 33.3*  PLT 83*   ------------------------------------------------------------------------------------------------------------------  Chemistries  Recent Labs  Lab 05/01/18 2051  NA 152*  K 4.4  CL 115*  CO2 28  GLUCOSE 116*  BUN 40*  CREATININE 1.89*  CALCIUM 9.4  AST 58*  ALT 25  ALKPHOS 55  BILITOT 0.5   ------------------------------------------------------------------------------------------------------------------  Cardiac Enzymes Recent Labs  Lab 05/01/18 2051  TROPONINI 0.07*   ------------------------------------------------------------------------------------------------------------------  RADIOLOGY:  Ct Head Wo Contrast  Result Date: 05/01/2018 CLINICAL DATA:  Syncopal episode.  Altered mental status EXAM: CT HEAD WITHOUT CONTRAST TECHNIQUE: Contiguous axial images were obtained from the base of the skull through the vertex  without intravenous contrast. COMPARISON:  06/20/2017 FINDINGS: Brain: No acute intracranial abnormality. Specifically, no hemorrhage, hydrocephalus, mass lesion, acute infarction, or significant intracranial injury. Vascular: No hyperdense vessel or unexpected calcification. Skull: No acute calvarial abnormality. Sinuses/Orbits: Visualized paranasal sinuses and mastoids clear. Orbital soft tissues unremarkable. Other: None IMPRESSION: No acute intracranial abnormality. Electronically Signed   By: Rolm Baptise M.D.   On: 05/01/2018 21:57   Ct Angio Chest Pe W And/or Wo Contrast  Result Date: 05/01/2018 CLINICAL DATA:  Weakness and hypoxia. EXAM: CT ANGIOGRAPHY CHEST WITH CONTRAST TECHNIQUE: Multidetector CT imaging of the chest was performed using the standard protocol during bolus administration of intravenous contrast. Multiplanar CT image reconstructions and MIPs were obtained to evaluate the vascular anatomy. CONTRAST:  89mL OMNIPAQUE IOHEXOL 350 MG/ML SOLN COMPARISON:  None. FINDINGS: Cardiovascular: Good opacification of the central and segmental pulmonary arteries. No focal filling defects. No evidence of significant pulmonary embolus. Normal heart size. No pericardial effusion. Normal caliber thoracic aorta. No aortic dissection. Great vessel origins are patent. Mediastinum/Nodes: The esophagus is diffusely dilated and filled with fluid and ingested material. Dilatation extends to the esophagogastric junction. Differential diagnosis would include achalasia, distal stricture, or distal esophageal carcinoma. Mildly prominent lymph nodes noted at the EG junction may favor tumor. Consider upper endoscopy for further evaluation. Lungs/Pleura: Patchy infiltration and consolidation in the lung bases may represent aspiration or pneumonia. No pleural effusions. No pneumothorax. Airways are patent. Upper Abdomen: No acute abnormalities identified. Musculoskeletal: Severe degenerative changes in both shoulders with  bilateral shoulder effusions, greater on the left. Degenerative changes in the spine. Review of the MIP images confirms the above findings. IMPRESSION: 1. No evidence of significant pulmonary embolus. 2. Dilated fluid-filled esophagus extending to the esophagogastric junction. Differential diagnosis would include achalasia, distal stricture, or distal esophageal carcinoma. Mild prominence of lymph nodes in the EG junction may favor tumor. Consider upper endoscopy for further evaluation. 3. Patchy infiltration and consolidation in the lung bases may represent aspiration or pneumonia. 4. Severe degenerative changes in both shoulders with bilateral shoulder effusions, greater on the left. Electronically Signed   By: Lucienne Capers M.D.   On: 05/01/2018 23:48   Dg Chest Portable 1 View  Result Date: 05/01/2018 CLINICAL DATA:  Syncope EXAM: PORTABLE CHEST 1 VIEW COMPARISON:  01/02/2018 FINDINGS: 2110 hours. Low volumes. Cardiopericardial silhouette is at upper limits of normal for size. Bibasilar atelectasis or infiltrate noted. The visualized bony structures of  the thorax are intact. Degenerative changes evident in both shoulders. Telemetry leads overlie the chest. IMPRESSION: Low volume film with bibasilar atelectasis or infiltrate. Electronically Signed   By: Misty Stanley M.D.   On: 05/01/2018 21:43    EKG:   Orders placed or performed during the hospital encounter of 05/01/18  . EKG 12-Lead  . EKG 12-Lead  . EKG 12-Lead  . EKG 12-Lead  . ED EKG  . ED EKG    IMPRESSION AND PLAN:  Principal Problem:   Severe sepsis ([place]) -sepsis is due to pneumonia as below, blood pressure stable, lactic acid barely elevated at 2, IV fluids initiated and will trend lactic acid until within normal limits, antibiotics started, cultures sent Active Problems:   HCAP (healthcare-associated pneumonia) -antibiotics as above, other supportive treatment as needed   AKI (acute kidney injury) ([place]) -IV fluids as above,  avoid nephrotoxins and monitor for expected improvement   Pancytopenia -neutropenic precautions, pancytopenia is due to his chemotherapy   Esophageal cancer [facility]) -oncology consult for any further recommendations   GERD (gastroesophageal reflux disease) -home dose PPI   Cerebral palsy ([place])  Chart review performed and case discussed with ED provider. Labs, imaging and/or ECG reviewed by provider and discussed with patient/family. Management plans discussed with the patient and/or family.  DVT PROPHYLAXIS: Mechanical only  GI PROPHYLAXIS:  PPI  ADMISSION STATUS: Inpatient     CODE STATUS: Full Code Status History    Date Active Date Inactive Code Status Order ID Comments User Context   06/30/2017 2252 07/01/2017 2101 Full Code [id number]  Saundra Shelling, MD Inpatient      TOTAL TIME TAKING CARE OF THIS PATIENT: 45 minutes.   Ethlyn Daniels 05/01/2018, 11:51 PM  [facility]  Office  [phone]  CC: Primary care physician; Patient, No Pcp Per  Note:  This document was prepared using Dragon voice recognition software and may include unintentional dictation errors.

## 2018-05-01 NOTE — ED Provider Notes (Addendum)
[facility] Emergency Department Provider Note  Time seen: 9:23 PM  I have reviewed the triage vital signs and the nursing notes.   HISTORY  Chief Complaint Shortness of Breath and Altered Mental Status    HPI Bob [name] is a 73 y.o. male with a past medical history of cerebral palsy, depression, gastric reflux, mild cognitive disorder, presents to the emergency department for weakness and hypoxia.  According EMS they were called out to the patient's nursing facility for generalized weakness and fatigue, found the patient to be hypoxic in the low 80s on room air, no baseline O2 requirement.  Were only able to get into the upper 80s on nasal cannula switched to a nonrebreather mask.  Upon arrival patient is on a nonrebreather mask satting 95%.  Patient is mildly hypotensive 90/60.  Patient is awake, he is alert, but he is very slow to respond, is speaking, is not entirely clear his mental baseline.   Past Medical History:  Diagnosis Date  . Back pain   . Cancer ([place])   . Cerebral palsy ([place])   . DDD (degenerative disc disease), lumbar   . Depression   . Depression   . GERD (gastroesophageal reflux disease)   . Mild cognitive disorder   . Other intervertebral disc degeneration, lumbar region     Patient Active Problem List   Diagnosis Date Noted  . GI bleed 06/30/2017    Past Surgical History:  Procedure Laterality Date  . TOTAL HIP ARTHROPLASTY      Prior to Admission medications   Medication Sig Start Date End Date Taking? Authorizing Provider  acetaminophen (TYLENOL) 325 MG tablet Take 650 mg by mouth every 6 (six) hours as needed for mild pain or fever.    [provider]  acetaminophen (TYLENOL) 500 MG tablet Take 500 mg by mouth 2 (two) times daily.    [provider]  ARIPiprazole (ABILIFY) 2 MG tablet Take 2 mg by mouth every Monday, Wednesday, and Friday.    [provider]  aspirin EC 81 MG tablet Take 81 mg by  mouth daily.    [provider]  bisacodyl (DULCOLAX) 5 MG EC tablet Take 5 mg by mouth at bedtime.    [provider]  cetirizine (ZYRTEC) 10 MG tablet Take 10 mg by mouth daily.    [provider]  Cholecalciferol (D3-1000 PO) Take 1,000 Units by mouth daily.    [provider]  docusate sodium (COLACE) 100 MG capsule Take 100 mg by mouth daily.     [provider]  DULoxetine (CYMBALTA) 60 MG capsule Take 60 mg by mouth daily.    [provider]  gabapentin (NEURONTIN) 600 MG tablet Take 600 mg by mouth at bedtime.    [provider]  levothyroxine (SYNTHROID, LEVOTHROID) 100 MCG tablet Take 100 mcg by mouth daily before breakfast.    [provider]  lithium carbonate 300 MG capsule Take 300 mg by mouth 2 (two) times daily with a meal.    [provider]  Melatonin 3 MG TABS Take 3 mg by mouth at bedtime.    [provider]  memantine (NAMENDA XR) 28 MG CP24 24 hr capsule Take 28 mg by mouth daily.    [provider]  Menthol (ICY HOT BACK) 5 % PTCH Apply 1 patch topically daily.    [provider]  modafinil (PROVIGIL) 200 MG tablet Take 200 mg by mouth every Sunday.    [provider]  morphine (MS CONTIN) 15 MG 12 hr tablet Take 1 tablet (15 mg total) by mouth every 12 (twelve) hours. 11/24/16   Earleen Newport, MD  Multiple Vitamins-Minerals (CERTAVITE SENIOR/ANTIOXIDANT PO) Take 1 tablet by mouth daily.    [provider]  Olopatadine HCl 0.2 % SOLN 1 drop each eye once daily 07/23/17   [name], PA-C  oxyCODONE-acetaminophen (PERCOCET) 5-325 MG tablet Take 2 tablets by mouth every 6 (six) hours as needed for moderate pain or severe pain. Patient taking differently: Take 1 tablet by mouth every 4 (four) hours as needed (breakthrough pain).  08/12/15   Earleen Newport, MD  pantoprazole (PROTONIX) 40 MG tablet Take 1 tablet (40 mg total) by mouth daily.  07/01/17   [name], MD  polyethylene glycol (MIRALAX / GLYCOLAX) packet Take 17 g by mouth daily.    [provider]  simvastatin (ZOCOR) 20 MG tablet Take 20 mg by mouth at bedtime.    [provider]  tamsulosin (FLOMAX) 0.4 MG CAPS capsule Take 0.4 mg by mouth daily after supper.    [provider]  trolamine salicylate (ASPERCREME) 10 % cream Apply 1 application topically as needed for muscle pain (FOR HANDS AND RIGHT SHOULDER).    [provider]    No Known Allergies  No family history on file.  Social History Social History   Tobacco Use  . Smoking status: Former Research scientist (life sciences)  . Smokeless tobacco: Never Used  Substance Use Topics  . Alcohol use: No  . Drug use: No    Review of Systems Unable to obtain an adequate/accurate review of systems secondary to altered mental status/weakness.  ____________________________________________   PHYSICAL EXAM:  VITAL SIGNS: ED Triage Vitals  Enc Vitals Group     BP 05/01/18 2053 90/60     Pulse Rate 05/01/18 2053 (!) 122     Resp 05/01/18 2053 20     Temp 05/01/18 2053 98.2 F (36.8 C)     Temp Source 05/01/18 2053 Oral     SpO2 05/01/18 2048 (!) 83 %     Weight 05/01/18 2047 167 lb (75.8 kg)     Height 05/01/18 2047 5\' 7"  (1.702 m)     Head Circumference --      Peak Flow --      Pain Score --      Pain Loc --      Pain Edu? --      Excl. in [place]? --    Constitutional: Patient is awake and alert but very slow to respond, very sluggish, almost inaudible responses. Eyes: Normal exam ENT   Head: Normocephalic and atraumatic   Mouth/Throat: Dry mucous membranes. Cardiovascular: Regular rhythm, rate around 120 bpm.   Respiratory: Normal respiratory effort without tachypnea nor retractions. Breath sounds are clear, no obvious wheeze rales or rhonchi on exam. Gastrointestinal: Soft and nontender. No distention.  No reaction to abdominal palpation. Musculoskeletal: Nontender with normal  range of motion in all extremities. Neurologic: Very slow speech, slow to respond but does appear to be able to move all extremities. Skin:  Skin is warm, dry and intact.  Psychiatric: Mood and affect are normal.   ____________________________________________    EKG  EKG viewed and interpreted by myself shows a sinus tachycardia 125 bpm with a narrow QRS, left axis deviation, largely normal intervals with nonspecific ST changes.  No concerning ST elevation noted.  ____________________________________________    RADIOLOGY  X-ray shows bibasilar atelectasis  versus infiltrates.  ____________________________________________   INITIAL IMPRESSION / ASSESSMENT AND PLAN / ED COURSE  Pertinent labs & imaging results that were available during my care of the patient were reviewed by me and considered in my medical decision making (see chart for details).  She presents to the emergency department for generalized weakness, fatigue found to be hypoxic.  Differential would include ACS, pulmonary edema, pneumonia, infectious etiology, metabolic abnormality.  We will check labs, chest x-ray, CT scan of the head, IV hydrate continue to closely monitor.  Currently maintaining sats in the low to mid 90s on 6 L nasal cannula.  X-ray shows bibasilar atelectasis versus infiltrates.  Given the new onset oxygen requirement with tachycardia hypotension, meeting sepsis criteria will begin IV antibiotics, send blood cultures, lactic acid and continue to closely monitor.  CBC is resulted with a white blood cell count of 0.6, likely neutropenic due to chemotherapy for esophageal cancer.  Given the acute onset of hypoxia today along with slight troponin elevation I believe a CT angiography would be warranted to help rule out PE or other infectious etiology.   CRITICAL CARE Performed by: Harvest Dark   Total critical care time: 30 minutes  Critical care time was exclusive of separately billable  procedures and treating other patients.  Critical care was necessary to treat or prevent imminent or life-threatening deterioration.  Critical care was time spent personally by me on the following activities: development of treatment plan with patient and/or surrogate as well as nursing, discussions with consultants, evaluation of patient's response to treatment, examination of patient, obtaining history from patient or surrogate, ordering and performing treatments and interventions, ordering and review of laboratory studies, ordering and review of radiographic studies, pulse oximetry and re-evaluation of patient's condition.  ____________________________________________   FINAL CLINICAL IMPRESSION(S) / ED DIAGNOSES  Altered mental status Weakness Hypoxia Pneumonia   Harvest Dark, MD 05/01/18 2216    Harvest Dark, MD 05/01/18 2251

## 2018-05-02 DIAGNOSIS — A419 Sepsis, unspecified organism: Principal | ICD-10-CM

## 2018-05-02 DIAGNOSIS — E870 Hyperosmolality and hypernatremia: Secondary | ICD-10-CM

## 2018-05-02 DIAGNOSIS — J189 Pneumonia, unspecified organism: Secondary | ICD-10-CM

## 2018-05-02 DIAGNOSIS — R6520 Severe sepsis without septic shock: Secondary | ICD-10-CM

## 2018-05-02 DIAGNOSIS — D61818 Other pancytopenia: Secondary | ICD-10-CM

## 2018-05-02 DIAGNOSIS — C159 Malignant neoplasm of esophagus, unspecified: Secondary | ICD-10-CM

## 2018-05-02 LAB — CBC WITH DIFFERENTIAL/PLATELET
Abs Immature Granulocytes: 0 10*3/uL (ref 0.00–0.07)
Basophils Absolute: 0 10*3/uL (ref 0.0–0.1)
Basophils Relative: 2 %
Eosinophils Absolute: 0 10*3/uL (ref 0.0–0.5)
Eosinophils Relative: 0 %
HCT: 28.4 % — ABNORMAL LOW (ref 39.0–52.0)
Hemoglobin: 8 g/dL — ABNORMAL LOW (ref 13.0–17.0)
Immature Granulocytes: 0 %
Lymphocytes Relative: 7 %
Lymphs Abs: 0 10*3/uL — ABNORMAL LOW (ref 0.7–4.0)
MCH: 26.1 pg (ref 26.0–34.0)
MCHC: 28.2 g/dL — ABNORMAL LOW (ref 30.0–36.0)
MCV: 92.8 fL (ref 80.0–100.0)
Monocytes Absolute: 0 10*3/uL — ABNORMAL LOW (ref 0.1–1.0)
Monocytes Relative: 7 %
Neutro Abs: 0.5 10*3/uL — ABNORMAL LOW (ref 1.7–7.7)
Neutrophils Relative %: 84 %
Platelets: 61 10*3/uL — ABNORMAL LOW (ref 150–400)
RBC: 3.06 MIL/uL — ABNORMAL LOW (ref 4.22–5.81)
RDW: 19.9 % — ABNORMAL HIGH (ref 11.5–15.5)
Smear Review: NORMAL
WBC: 0.6 10*3/uL — CL (ref 4.0–10.5)
nRBC: 0 % (ref 0.0–0.2)

## 2018-05-02 LAB — BASIC METABOLIC PANEL
Anion gap: 9 (ref 5–15)
BUN: 39 mg/dL — ABNORMAL HIGH (ref 8–23)
CO2: 25 mmol/L (ref 22–32)
CREATININE: 1.46 mg/dL — AB (ref 0.61–1.24)
Calcium: 8.5 mg/dL — ABNORMAL LOW (ref 8.9–10.3)
Chloride: 121 mmol/L — ABNORMAL HIGH (ref 98–111)
GFR calc Af Amer: 55 mL/min — ABNORMAL LOW (ref >60)
GFR calc non Af Amer: 48 mL/min — ABNORMAL LOW (ref >60)
Glucose, Bld: 88 mg/dL (ref 70–99)
Potassium: 4.5 mmol/L (ref 3.5–5.1)
Sodium: 155 mmol/L — ABNORMAL HIGH (ref 135–145)

## 2018-05-02 LAB — CBC
HCT: 27.7 % — ABNORMAL LOW (ref 39.0–52.0)
HEMOGLOBIN: 8 g/dL — AB (ref 13.0–17.0)
MCH: 26.1 pg (ref 26.0–34.0)
MCHC: 28.9 g/dL — ABNORMAL LOW (ref 30.0–36.0)
MCV: 90.2 fL (ref 80.0–100.0)
Platelets: 69 10*3/uL — ABNORMAL LOW (ref 150–400)
RBC: 3.07 MIL/uL — ABNORMAL LOW (ref 4.22–5.81)
RDW: 19.9 % — ABNORMAL HIGH (ref 11.5–15.5)
WBC: 0.6 10*3/uL — CL (ref 4.0–10.5)
nRBC: 0 % (ref 0.0–0.2)

## 2018-05-02 LAB — MRSA PCR SCREENING: MRSA BY PCR: NEGATIVE

## 2018-05-02 LAB — LACTIC ACID, PLASMA: Lactic Acid, Venous: 1 mmol/L (ref 0.5–1.9)

## 2018-05-02 MED ORDER — SODIUM CHLORIDE 0.9 % IV SOLN
2.0000 g | Freq: Two times a day (BID) | INTRAVENOUS | Status: DC
  Administered 2018-05-02 – 2018-05-03 (×3): 2 g via INTRAVENOUS
  Filled 2018-05-02 (×4): qty 2

## 2018-05-02 MED ORDER — VANCOMYCIN HCL 10 G IV SOLR
1500.0000 mg | Freq: Once | INTRAVENOUS | Status: AC
  Administered 2018-05-02: 1500 mg via INTRAVENOUS
  Filled 2018-05-02: qty 1500

## 2018-05-02 MED ORDER — ACETAMINOPHEN 650 MG RE SUPP
650.0000 mg | Freq: Four times a day (QID) | RECTAL | Status: DC | PRN
  Administered 2018-05-02: 650 mg via RECTAL
  Filled 2018-05-02: qty 1

## 2018-05-02 MED ORDER — DEXTROSE-NACL 5-0.9 % IV SOLN
INTRAVENOUS | Status: DC
  Administered 2018-05-02 (×2): via INTRAVENOUS

## 2018-05-02 MED ORDER — ONDANSETRON HCL 4 MG/2ML IJ SOLN
4.0000 mg | Freq: Four times a day (QID) | INTRAMUSCULAR | Status: DC | PRN
  Administered 2018-05-02: 4 mg via INTRAVENOUS
  Filled 2018-05-02: qty 2

## 2018-05-02 MED ORDER — SODIUM CHLORIDE 0.9 % IV SOLN
INTRAVENOUS | Status: DC
  Administered 2018-05-02: 05:00:00 via INTRAVENOUS

## 2018-05-02 MED ORDER — VANCOMYCIN HCL IN DEXTROSE 750-5 MG/150ML-% IV SOLN
750.0000 mg | INTRAVENOUS | Status: DC
  Administered 2018-05-02: 750 mg via INTRAVENOUS
  Filled 2018-05-02 (×2): qty 150

## 2018-05-02 MED ORDER — ONDANSETRON HCL 4 MG PO TABS: 4.0000 mg | ORAL_TABLET | Freq: Four times a day (QID) | ORAL | Status: DC | PRN

## 2018-05-02 MED ORDER — ACETAMINOPHEN 325 MG PO TABS
650.0000 mg | ORAL_TABLET | Freq: Four times a day (QID) | ORAL | Status: DC | PRN
  Administered 2018-05-03 (×2): 650 mg via ORAL
  Filled 2018-05-02 (×2): qty 2

## 2018-05-02 MED ORDER — ORAL CARE MOUTH RINSE
15.0000 mL | Freq: Two times a day (BID) | OROMUCOSAL | Status: DC
  Administered 2018-05-02 – 2018-05-08 (×12): 15 mL via OROMUCOSAL

## 2018-05-02 NOTE — ED Notes (Signed)
ED TO INPATIENT HANDOFF REPORT  Name/Age/Gender Bob [name] 73 y.o. male  Code Status    Code Status Orders  (From admission, onward)         Start     Ordered   05/02/18 0805  Full code  Continuous     05/02/18 0804        Code Status History    Date Active Date Inactive Code Status Order ID Comments User Context   06/30/2017 2252 07/01/2017 2101 Full Code [id number]  Saundra Shelling, MD Inpatient      Home/SNF/Other Nursing Home  Chief Complaint AMS   Level of Care/Admitting Diagnosis ED Disposition    ED Disposition Condition [facility] Area: [place] [[id number]]  Level of Care: Med-Surg [16]  Diagnosis: Severe sepsis [facility]) [[id number]]  Admitting Physician: Lance Coon [[id number]]  Attending Physician: Lance Coon [id number]  Estimated length of stay: past midnight tomorrow  Certification:: I certify this patient will need inpatient services for at least 2 midnights  PT Class (Do Not Modify): Inpatient [101]  PT Acc Code (Do Not Modify): Private [1]       Medical History Past Medical History:  Diagnosis Date  . Back pain   . Cancer ([place])   . Cerebral palsy ([place])   . DDD (degenerative disc disease), lumbar   . Depression   . Depression   . GERD (gastroesophageal reflux disease)   . Mild cognitive disorder   . Other intervertebral disc degeneration, lumbar region     Allergies No Known Allergies  IV Location/Drains/Wounds Patient Lines/Drains/Airways Status   Active Line/Drains/Airways    Name:   Placement date:   Placement time:   Site:   Days:   Peripheral IV 05/01/18 Left Forearm   05/01/18    2105    Forearm   1   Peripheral IV 05/01/18 Right Antecubital   05/01/18    2105    Antecubital   1          Labs/Imaging Results for orders placed or performed during the hospital encounter of 05/01/18 (from the past 48 hour(s))  CBC     Status: Abnormal   Collection Time: 05/01/18  8:51 PM  Result Value Ref  Range   WBC 0.6 (LL) 4.0 - 10.5 K/uL    Comment: This critical result has verified and been called to [facility] by [facility] on 02 23 2020 at 2237, and has been read back. CONFIRMED BY SMEAR   RBC 3.73 (L) 4.22 - 5.81 MIL/uL   Hemoglobin 9.7 (L) 13.0 - 17.0 g/dL   HCT 33.3 (L) 39.0 - 52.0 %   MCV 89.3 80.0 - 100.0 fL   MCH 26.0 26.0 - 34.0 pg   MCHC 29.1 (L) 30.0 - 36.0 g/dL   RDW 19.9 (H) 11.5 - 15.5 %   Platelets 83 (L) 150 - 400 K/uL    Comment: Immature Platelet Fraction may be clinically indicated, consider ordering this additional test [id number]    nRBC 0.0 0.0 - 0.2 %    Comment: Performed at [facility], [address] [place], [place] [address]  Comprehensive metabolic panel     Status: Abnormal   Collection Time: 05/01/18  8:51 PM  Result Value Ref Range   Sodium 152 (H) 135 - 145 mmol/L   Potassium 4.4 3.5 - 5.1 mmol/L   Chloride 115 (H) 98 - 111 mmol/L   CO2 28 22 - 32 mmol/L  Glucose, Bld 116 (H) 70 - 99 mg/dL   BUN 40 (H) 8 - 23 mg/dL   Creatinine, Ser 1.89 (H) 0.61 - 1.24 mg/dL   Calcium 9.4 8.9 - 10.3 mg/dL   Total Protein 6.9 6.5 - 8.1 g/dL   Albumin 3.1 (L) 3.5 - 5.0 g/dL   AST 58 (H) 15 - 41 U/L   ALT 25 0 - 44 U/L   Alkaline Phosphatase 55 38 - 126 U/L   Total Bilirubin 0.5 0.3 - 1.2 mg/dL   GFR calc non Af Amer 35 (L) >60 mL/min   GFR calc Af Amer 40 (L) >60 mL/min   Anion gap 9 5 - 15    Comment: Performed at [facility], [address] [place], [place] [address]  Troponin I - ONCE - STAT     Status: Abnormal   Collection Time: 05/01/18  8:51 PM  Result Value Ref Range   Troponin I 0.07 (HH) <0.03 ng/mL    Comment: CRITICAL RESULT CALLED TO, READ BACK BY AND VERIFIED WITH DEE MCCLAIN ON 05/01/18 AT 2140 BY JAG Performed at [facility], [address] [place], [place] [address]   Urinalysis, Complete w Microscopic     Status: Abnormal   Collection Time: 05/01/18  9:16 PM  Result Value Ref Range    Color, Urine YELLOW (A) YELLOW   APPearance CLEAR (A) CLEAR   Specific Gravity, Urine 1.016 1.005 - 1.030   pH 5.0 5.0 - 8.0   Glucose, UA NEGATIVE NEGATIVE mg/dL   Hgb urine dipstick SMALL (A) NEGATIVE   Bilirubin Urine NEGATIVE NEGATIVE   Ketones, ur NEGATIVE NEGATIVE mg/dL   Protein, ur 30 (A) NEGATIVE mg/dL   Nitrite NEGATIVE NEGATIVE   Leukocytes,Ua NEGATIVE NEGATIVE   WBC, UA 0-5 0 - 5 WBC/hpf   Bacteria, UA NONE SEEN NONE SEEN   Squamous Epithelial / LPF 0-5 0 - 5    Comment: Performed at [facility], [address] [place], [place] [address]  Blood culture (routine x 2)     Status: None (Preliminary result)   Collection Time: 05/01/18 10:36 PM  Result Value Ref Range   Specimen Description BLOOD LEFT HAND    Special Requests      BOTTLES DRAWN AEROBIC ONLY Blood Culture results may not be optimal due to an inadequate volume of blood received in culture bottles   Culture      NO GROWTH < 12 HOURS Performed at [facility], [address] [place], [place] [address]    Report Status PENDING   Lactic acid, plasma     Status: Abnormal   Collection Time: 05/01/18 10:37 PM  Result Value Ref Range   Lactic Acid, Venous 2.0 (HH) 0.5 - 1.9 mmol/L    Comment: CRITICAL RESULT CALLED TO, READ BACK BY AND VERIFIED WITH DEE MCCLAIN ON 05/01/18 AT 2318 BY JAG Performed at [facility], [address] [place], [place] [address]   Blood culture (routine x 2)     Status: None (Preliminary result)   Collection Time: 05/01/18 10:45 PM  Result Value Ref Range   Specimen Description BLOOD RIGHT HAND    Special Requests      BOTTLES DRAWN AEROBIC AND ANAEROBIC Blood Culture adequate volume   Culture      NO GROWTH < 12 HOURS Performed at [facility], [address] [place],  [address]    Report Status PENDING   Lactic acid, plasma  Status: None   Collection Time: 05/02/18  4:58 AM  Result Value Ref Range   Lactic Acid, Venous 1.0  0.5 - 1.9 mmol/L    Comment: Performed at [facility], [address] [place], [place] [address]  Basic metabolic panel     Status: Abnormal   Collection Time: 05/02/18  4:58 AM  Result Value Ref Range   Sodium 155 (H) 135 - 145 mmol/L   Potassium 4.5 3.5 - 5.1 mmol/L   Chloride 121 (H) 98 - 111 mmol/L   CO2 25 22 - 32 mmol/L   Glucose, Bld 88 70 - 99 mg/dL   BUN 39 (H) 8 - 23 mg/dL   Creatinine, Ser 1.46 (H) 0.61 - 1.24 mg/dL   Calcium 8.5 (L) 8.9 - 10.3 mg/dL   GFR calc non Af Amer 48 (L) >60 mL/min   GFR calc Af Amer 55 (L) >60 mL/min   Anion gap 9 5 - 15    Comment: Performed at [facility], [address] [place], [place] [address]  CBC     Status: Abnormal   Collection Time: 05/02/18  4:58 AM  Result Value Ref Range   WBC 0.6 (LL) 4.0 - 10.5 K/uL    Comment: REPEATED TO VERIFY CRITICAL VALUE NOTED.  VALUE IS CONSISTENT WITH PREVIOUSLY REPORTED AND CALLED VALUE.    RBC 3.07 (L) 4.22 - 5.81 MIL/uL   Hemoglobin 8.0 (L) 13.0 - 17.0 g/dL   HCT 27.7 (L) 39.0 - 52.0 %   MCV 90.2 80.0 - 100.0 fL   MCH 26.1 26.0 - 34.0 pg   MCHC 28.9 (L) 30.0 - 36.0 g/dL   RDW 19.9 (H) 11.5 - 15.5 %   Platelets 69 (L) 150 - 400 K/uL    Comment: REPEATED TO VERIFY SPECIMEN CHECKED FOR CLOTS Immature Platelet Fraction may be clinically indicated, consider ordering this additional test [id number] CONSISTENT WITH PREVIOUS RESULT    nRBC 0.0 0.0 - 0.2 %    Comment: Performed at [facility], [address] [place], [place] [address]  CBC with Differential/Platelet     Status: Abnormal   Collection Time: 05/02/18  4:58 AM  Result Value Ref Range   WBC 0.6 (LL) 4.0 - 10.5 K/uL    Comment: CRITICAL VALUE NOTED.  VALUE IS CONSISTENT WITH PREVIOUSLY REPORTED AND CALLED VALUE.   RBC 3.06 (L) 4.22 - 5.81 MIL/uL   Hemoglobin 8.0 (L) 13.0 - 17.0 g/dL   HCT 28.4 (L) 39.0 - 52.0 %   MCV 92.8 80.0 - 100.0 fL   MCH 26.1 26.0 - 34.0 pg   MCHC 28.2 (L) 30.0 - 36.0 g/dL    RDW 19.9 (H) 11.5 - 15.5 %   Platelets 61 (L) 150 - 400 K/uL    Comment: Immature Platelet Fraction may be clinically indicated, consider ordering this additional test [id number]    nRBC 0.0 0.0 - 0.2 %   Neutrophils Relative % 84 %   Neutro Abs 0.5 (L) 1.7 - 7.7 K/uL   Lymphocytes Relative 7 %   Lymphs Abs 0.0 (L) 0.7 - 4.0 K/uL   Monocytes Relative 7 %   Monocytes Absolute 0.0 (L) 0.1 - 1.0 K/uL   Eosinophils Relative 0 %   Eosinophils Absolute 0.0 0.0 - 0.5 K/uL   Basophils Relative 2 %   Basophils Absolute 0.0 0.0 - 0.1 K/uL   WBC Morphology MORPHOLOGY UNREMARKABLE    Smear Review Normal platelet morphology    Immature Granulocytes 0 %  Abs Immature Granulocytes 0.00 0.00 - 0.07 K/uL   Burr Cells PRESENT    Ovalocytes PRESENT     Comment: Performed at [facility], [address] [place], [place] [address]   Ct Head Wo Contrast  Result Date: 05/01/2018 CLINICAL DATA:  Syncopal episode.  Altered mental status EXAM: CT HEAD WITHOUT CONTRAST TECHNIQUE: Contiguous axial images were obtained from the base of the skull through the vertex without intravenous contrast. COMPARISON:  06/20/2017 FINDINGS: Brain: No acute intracranial abnormality. Specifically, no hemorrhage, hydrocephalus, mass lesion, acute infarction, or significant intracranial injury. Vascular: No hyperdense vessel or unexpected calcification. Skull: No acute calvarial abnormality. Sinuses/Orbits: Visualized paranasal sinuses and mastoids clear. Orbital soft tissues unremarkable. Other: None IMPRESSION: No acute intracranial abnormality. Electronically Signed   By: Rolm Baptise M.D.   On: 05/01/2018 21:57   Ct Angio Chest Pe W And/or Wo Contrast  Result Date: 05/01/2018 CLINICAL DATA:  Weakness and hypoxia. EXAM: CT ANGIOGRAPHY CHEST WITH CONTRAST TECHNIQUE: Multidetector CT imaging of the chest was performed using the standard protocol during bolus administration of intravenous contrast. Multiplanar CT image  reconstructions and MIPs were obtained to evaluate the vascular anatomy. CONTRAST:  4mL OMNIPAQUE IOHEXOL 350 MG/ML SOLN COMPARISON:  None. FINDINGS: Cardiovascular: Good opacification of the central and segmental pulmonary arteries. No focal filling defects. No evidence of significant pulmonary embolus. Normal heart size. No pericardial effusion. Normal caliber thoracic aorta. No aortic dissection. Great vessel origins are patent. Mediastinum/Nodes: The esophagus is diffusely dilated and filled with fluid and ingested material. Dilatation extends to the esophagogastric junction. Differential diagnosis would include achalasia, distal stricture, or distal esophageal carcinoma. Mildly prominent lymph nodes noted at the EG junction may favor tumor. Consider upper endoscopy for further evaluation. Lungs/Pleura: Patchy infiltration and consolidation in the lung bases may represent aspiration or pneumonia. No pleural effusions. No pneumothorax. Airways are patent. Upper Abdomen: No acute abnormalities identified. Musculoskeletal: Severe degenerative changes in both shoulders with bilateral shoulder effusions, greater on the left. Degenerative changes in the spine. Review of the MIP images confirms the above findings. IMPRESSION: 1. No evidence of significant pulmonary embolus. 2. Dilated fluid-filled esophagus extending to the esophagogastric junction. Differential diagnosis would include achalasia, distal stricture, or distal esophageal carcinoma. Mild prominence of lymph nodes in the EG junction may favor tumor. Consider upper endoscopy for further evaluation. 3. Patchy infiltration and consolidation in the lung bases may represent aspiration or pneumonia. 4. Severe degenerative changes in both shoulders with bilateral shoulder effusions, greater on the left. Electronically Signed   By: Lucienne Capers M.D.   On: 05/01/2018 23:48   Dg Chest Portable 1 View  Result Date: 05/01/2018 CLINICAL DATA:  Syncope EXAM:  PORTABLE CHEST 1 VIEW COMPARISON:  01/02/2018 FINDINGS: 2110 hours. Low volumes. Cardiopericardial silhouette is at upper limits of normal for size. Bibasilar atelectasis or infiltrate noted. The visualized bony structures of the thorax are intact. Degenerative changes evident in both shoulders. Telemetry leads overlie the chest. IMPRESSION: Low volume film with bibasilar atelectasis or infiltrate. Electronically Signed   By: Misty Stanley M.D.   On: 05/01/2018 21:43    Pending Labs Unresulted Labs (From admission, onward)    Start     Ordered   05/03/18 0500  CBC  Tomorrow morning,   STAT     05/02/18 1211   05/03/18 7893  Basic metabolic panel  Tomorrow morning,   STAT     05/02/18 1211   05/03/18 0500  Immature Platelet Fraction  Tomorrow morning,  STAT     05/02/18 1220   05/03/18 0500  Ferritin  Tomorrow morning,   STAT     05/02/18 1222   05/03/18 0500  Iron and TIBC  Tomorrow morning,   STAT     05/02/18 1222   05/03/18 0500  Vitamin B12  Tomorrow morning,   STAT     05/02/18 1222   05/03/18 0500  Folate  Tomorrow morning,   STAT     05/02/18 1222   05/02/18 1509  MRSA PCR Screening  ONCE - STAT,   STAT     05/02/18 1508          Vitals/Pain Today's Vitals   05/02/18 1330 05/02/18 1400 05/02/18 1430 05/02/18 1500  BP: 131/71 120/64 106/71 116/69  Pulse: (!) 110 (!) 109 (!) 115 (!) 109  Resp: (!) 24 (!) 31 (!) 29 (!) 31  Temp:      TempSrc:      SpO2: 95% 95% 95% 95%  Weight:      Height:        Isolation Precautions Protective Precautions  Medications Medications  acetaminophen (TYLENOL) tablet 650 mg ( Oral See Alternative 05/02/18 0452)    Or  acetaminophen (TYLENOL) suppository 650 mg (650 mg Rectal Given 05/02/18 0452)  ondansetron (ZOFRAN) tablet 4 mg (has no administration in time range)    Or  ondansetron (ZOFRAN) injection 4 mg (has no administration in time range)  pantoprazole (PROTONIX) injection 40 mg (40 mg Intravenous Given 05/02/18 0029)   ceFEPIme (MAXIPIME) 2 g in sodium chloride 0.9 % 100 mL IVPB (0 g Intravenous Stopped 05/02/18 1310)  vancomycin (VANCOCIN) IVPB 750 mg/150 ml premix (0 mg Intravenous Stopped 05/02/18 1456)  dextrose 5 %-0.9 % sodium chloride infusion ( Intravenous New Bag/Given 05/02/18 1324)  sodium chloride 0.9 % bolus 1,000 mL (0 mLs Intravenous Stopped 05/02/18 1010)  cefTRIAXone (ROCEPHIN) 1 g in sodium chloride 0.9 % 100 mL IVPB (0 g Intravenous Stopped 05/02/18 0026)  azithromycin (ZITHROMAX) 500 mg in sodium chloride 0.9 % 250 mL IVPB (0 mg Intravenous Stopped 05/02/18 0218)  sodium chloride 0.9 % bolus 1,000 mL (0 mLs Intravenous Stopped 05/02/18 1011)  iohexol (OMNIPAQUE) 350 MG/ML injection 60 mL (60 mLs Intravenous Contrast Given 05/01/18 2315)  vancomycin (VANCOCIN) 1,500 mg in sodium chloride 0.9 % 500 mL IVPB (0 mg Intravenous Stopped 05/02/18 0455)    Mobility non-ambulatory

## 2018-05-02 NOTE — ED Notes (Addendum)
Pt assisted to call cousin and given oral care

## 2018-05-02 NOTE — ED Notes (Signed)
Pt assisted to use urinal - pt repositioned in bed - family at bedside

## 2018-05-02 NOTE — ED Notes (Signed)
Mouth swabs given to pt for comfort

## 2018-05-02 NOTE — Progress Notes (Signed)
Pharmacy Antibiotic Note  Bob [name] is a 73 y.o. male admitted on 05/01/2018 with sepsis.  Pharmacy has been consulted for vancomycin and cefepime dosing.  Plan: DW 76kg  Vd 49L kei 0.032 hr-1  T1/2  Vancomycin 750 mg IV Q 24 hrs. Goal AUC 400-550. Expected AUC: 471 SCr used: 1.89   Height: 5\' 7"  (170.2 cm) Weight: 167 lb (75.8 kg) IBW/kg (Calculated) : 66.1  Temp (24hrs), Avg:98.2 F (36.8 C), Min:98.2 F (36.8 C), Max:98.2 F (36.8 C)  Recent Labs  Lab 05/01/18 2051 05/01/18 2237  WBC 0.6*  --   CREATININE 1.89*  --   LATICACIDVEN  --  2.0*    Estimated Creatinine Clearance: 33.5 mL/min (A) (by C-G formula based on SCr of 1.89 mg/dL (H)).    No Known Allergies  Antimicrobials this admission: Vancomycin, cefepime  >>    >>   Dose adjustments this admission:   Microbiology results: 2/23 BCx: pending      2/23 UA: (-) 2/23 CXR: atelectasis vs. infiltrate  Thank you for allowing pharmacy to be a part of this patient's care.  Brisha Mccabe S 05/02/2018 3:00 AM

## 2018-05-02 NOTE — ED Notes (Addendum)
Pt turned and cleaned. Swabs given to relieve dry mouth- brother in law giving oral care

## 2018-05-02 NOTE — Progress Notes (Addendum)
[place] [facility] [place] NAME: Bob [name]    MR#:  [id number]  DATE OF BIRTH:  15-Aug-1945  SUBJECTIVE:  CHIEF COMPLAINT:   Chief Complaint  Patient presents with  . Shortness of Breath  . Altered Mental Status  Patient seen and evaluated today Has shortness of breath On oxygen via nasal cannula Difficulty swallowing solids Currently on pured diet  REVIEW OF SYSTEMS:    ROS  CONSTITUTIONAL: No documented fever. Has fatigue, weakness. No weight gain, no weight loss.  EYES: No blurry or double vision.  ENT: No tinnitus. No postnasal drip. No redness of the oropharynx.  RESPIRATORY: Has cough, no wheeze, no hemoptysis. Has dyspnea.  CARDIOVASCULAR: No chest pain. No orthopnea. No palpitations. No syncope.  GASTROINTESTINAL: No nausea, no vomiting or diarrhea. No abdominal pain. No melena or hematochezia.  GENITOURINARY: No dysuria or hematuria.  ENDOCRINE: No polyuria or nocturia. No heat or cold intolerance.  HEMATOLOGY: No anemia. No bruising. No bleeding.  INTEGUMENTARY: No rashes. No lesions.  MUSCULOSKELETAL: No arthritis. No swelling. No gout.  NEUROLOGIC: No numbness, tingling, or ataxia. No seizure-type activity.  PSYCHIATRIC: No anxiety. No insomnia. No ADD.   DRUG ALLERGIES:  No Known Allergies  VITALS:  Blood pressure 124/85, pulse (!) 116, temperature 98.2 F (36.8 C), temperature source Oral, resp. rate 19, height 5\' 7"  (1.702 m), weight 75.8 kg, SpO2 96 %.  PHYSICAL EXAMINATION:   Physical Exam  GENERAL:  73 y.o.-year-old patient lying in the bed with no acute distress.  EYES: Pupils equal, round, reactive to light and accommodation. No scleral icterus. Extraocular muscles intact.  HEENT: Head atraumatic, normocephalic. Oropharynx and nasopharynx clear.  NECK:  Supple, no jugular venous distention. No thyroid enlargement, no tenderness.  LUNGS: Decreased breath sounds bilaterally, rales in left lung. No use of  accessory muscles of respiration.  CARDIOVASCULAR: S1, S2 tachycardia noted. No murmurs, rubs, or gallops.  ABDOMEN: Soft, nontender, nondistended. Bowel sounds present. No organomegaly or mass.  EXTREMITIES: No cyanosis, clubbing or edema b/l.    NEUROLOGIC: Cranial nerves II through XII are intact. No focal Motor or sensory deficits b/l.   PSYCHIATRIC: The patient is alert and oriented x 3.  SKIN: No obvious rash, lesion, or ulcer.   LABORATORY PANEL:   CBC Recent Labs  Lab 05/02/18 0458  WBC 0.6*  HGB 8.0*  HCT 27.7*  PLT 69*   ------------------------------------------------------------------------------------------------------------------ Chemistries  Recent Labs  Lab 05/01/18 2051 05/02/18 0458  NA 152* 155*  K 4.4 4.5  CL 115* 121*  CO2 28 25  GLUCOSE 116* 88  BUN 40* 39*  CREATININE 1.89* 1.46*  CALCIUM 9.4 8.5*  AST 58*  --   ALT 25  --   ALKPHOS 55  --   BILITOT 0.5  --    ------------------------------------------------------------------------------------------------------------------  Cardiac Enzymes Recent Labs  Lab 05/01/18 2051  TROPONINI 0.07*   ------------------------------------------------------------------------------------------------------------------  RADIOLOGY:  Ct Head Wo Contrast  Result Date: 05/01/2018 CLINICAL DATA:  Syncopal episode.  Altered mental status EXAM: CT HEAD WITHOUT CONTRAST TECHNIQUE: Contiguous axial images were obtained from the base of the skull through the vertex without intravenous contrast. COMPARISON:  06/20/2017 FINDINGS: Brain: No acute intracranial abnormality. Specifically, no hemorrhage, hydrocephalus, mass lesion, acute infarction, or significant intracranial injury. Vascular: No hyperdense vessel or unexpected calcification. Skull: No acute calvarial abnormality. Sinuses/Orbits: Visualized paranasal sinuses and mastoids clear. Orbital soft tissues unremarkable. Other: None IMPRESSION: No acute intracranial  abnormality. Electronically Signed  By: Rolm Baptise M.D.   On: 05/01/2018 21:57   Ct Angio Chest Pe W And/or Wo Contrast  Result Date: 05/01/2018 CLINICAL DATA:  Weakness and hypoxia. EXAM: CT ANGIOGRAPHY CHEST WITH CONTRAST TECHNIQUE: Multidetector CT imaging of the chest was performed using the standard protocol during bolus administration of intravenous contrast. Multiplanar CT image reconstructions and MIPs were obtained to evaluate the vascular anatomy. CONTRAST:  58mL OMNIPAQUE IOHEXOL 350 MG/ML SOLN COMPARISON:  None. FINDINGS: Cardiovascular: Good opacification of the central and segmental pulmonary arteries. No focal filling defects. No evidence of significant pulmonary embolus. Normal heart size. No pericardial effusion. Normal caliber thoracic aorta. No aortic dissection. Great vessel origins are patent. Mediastinum/Nodes: The esophagus is diffusely dilated and filled with fluid and ingested material. Dilatation extends to the esophagogastric junction. Differential diagnosis would include achalasia, distal stricture, or distal esophageal carcinoma. Mildly prominent lymph nodes noted at the EG junction may favor tumor. Consider upper endoscopy for further evaluation. Lungs/Pleura: Patchy infiltration and consolidation in the lung bases may represent aspiration or pneumonia. No pleural effusions. No pneumothorax. Airways are patent. Upper Abdomen: No acute abnormalities identified. Musculoskeletal: Severe degenerative changes in both shoulders with bilateral shoulder effusions, greater on the left. Degenerative changes in the spine. Review of the MIP images confirms the above findings. IMPRESSION: 1. No evidence of significant pulmonary embolus. 2. Dilated fluid-filled esophagus extending to the esophagogastric junction. Differential diagnosis would include achalasia, distal stricture, or distal esophageal carcinoma. Mild prominence of lymph nodes in the EG junction may favor tumor. Consider upper  endoscopy for further evaluation. 3. Patchy infiltration and consolidation in the lung bases may represent aspiration or pneumonia. 4. Severe degenerative changes in both shoulders with bilateral shoulder effusions, greater on the left. Electronically Signed   By: Lucienne Capers M.D.   On: 05/01/2018 23:48   Dg Chest Portable 1 View  Result Date: 05/01/2018 CLINICAL DATA:  Syncope EXAM: PORTABLE CHEST 1 VIEW COMPARISON:  01/02/2018 FINDINGS: 2110 hours. Low volumes. Cardiopericardial silhouette is at upper limits of normal for size. Bibasilar atelectasis or infiltrate noted. The visualized bony structures of the thorax are intact. Degenerative changes evident in both shoulders. Telemetry leads overlie the chest. IMPRESSION: Low volume film with bibasilar atelectasis or infiltrate. Electronically Signed   By: Misty Stanley M.D.   On: 05/01/2018 21:43     ASSESSMENT AND PLAN:   73 year old male patient with 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, GERD, cerebral palsy currently under hospitalist service for shortness of breath  -Sepsis secondary to pneumonia Broad-spectrum IV antibiotics Follow-up cultures and procalcitonin level IV vancomycin and cefepime antibiotic on board  -Healthcare associated pneumonia IV antibiotics Follow-up WBC count and cultures IV vancomycin and IV cefepime antibiotics  -Hypernatremia and dehydration IV hydration with D5 normal saline Follow-up sodium level  -Pancytopenia secondary to recent chemotherapy Oncology evaluation and neutropenic precautions  -Esophageal cancer Oncology follow-up  -Dysphagia secondary to esophageal cancer Pured diet  -Cerebral palsy Supportive care  All the records are reviewed and case discussed with Care Management/Social Worker. Management plans discussed with the patient, family and they are in agreement.  CODE STATUS: Full code  DVT Prophylaxis: SCDs  TOTAL TIME TAKING CARE OF THIS  PATIENT: 37 minutes.   POSSIBLE D/C IN 2 to 3 DAYS, DEPENDING ON CLINICAL CONDITION.  Saundra Shelling M.D on 05/02/2018 at 12:11 PM  Between 7am to 6pm - Pager - [phone]  After 6pm go to [email or web address] - password [email or web address] [facility]  Office  [phone]  CC: Primary care physician; Patient, No Pcp Per  Note: This dictation was prepared with Dragon dictation along with smaller phrase technology. Any transcriptional errors that result from this process are unintentional.

## 2018-05-02 NOTE — Progress Notes (Signed)
CODE SEPSIS - PHARMACY COMMUNICATION  **Broad Spectrum Antibiotics should be administered within 1 hour of Sepsis diagnosis**  Time Code Sepsis Called/Page Received: 7741 2878  Antibiotics Ordered: 6767 2094  Time of 1st antibiotic administration: 7096 2836  Additional action taken by pharmacy:   If necessary, Name of Provider/Nurse Contacted:     Eloise Harman ,PharmD Clinical Pharmacist  05/02/2018  2:07 AM

## 2018-05-02 NOTE — ED Notes (Signed)
Called pharmacy to send Vanc

## 2018-05-02 NOTE — ED Notes (Signed)
Pt moved to hospital bed.

## 2018-05-02 NOTE — ED Notes (Signed)
Pt assisted with using the urinal. Pt turned and repositioned. Feels warm. Temp 100.0 ax. Awaiting inpatient bed assignment.

## 2018-05-02 NOTE — ED Notes (Signed)
Handoff given to Danielle RN

## 2018-05-02 NOTE — Consult Note (Signed)
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 Marcille Blanco  Date of visit: 05/02/2018  History of presenting illness-patient is a 73 year old male with a history of severe arthritis depression and cerebral palsy who lives at an assisted living facility.  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  He is being treated with definitive chemoradiation and his last dose of chemotherapy was on 04/18/2018.  Since then his chemotherapy has been held due to ongoing pancytopenia. CBC with differential on 04/18/2018 showed a white count of 1.6, H&H of 10.1/33.9 and a platelet count of 191.  ANC on that day was 1 and patient received his fourth dose of carbotaxol chemotherapy and since then his chemotherapy has been on hold.  He has been admitted to the hospital with generalized weakness and shortness of breath and is being treated for healthcare associated pneumonia.  He was found to be pancytopenic with a white count of 0.6, H&H of 8/27.7 and a platelet count of 69.  Oncology consulted for further recommendations  It is difficult to understand the patient.  His brother-in-law is at his bedside.  He reports that he was brought to the hospital mainly because he passed out at the assisted living facility another episode of syncope was also reported by EMS   Review of systems- Review of Systems  Unable to perform ROS: Acuity of condition    No Known Allergies  Patient Active Problem List   Diagnosis Date Noted  . Severe sepsis ([place]) 05/01/2018  . HCAP (healthcare-associated pneumonia) 05/01/2018  . AKI (acute  kidney injury) ([place]) 05/01/2018  . GERD (gastroesophageal reflux disease) 05/01/2018  . Cerebral palsy ([place]) 05/01/2018  . Esophageal cancer ([place]) 05/01/2018  . GI bleed 06/30/2017     Past Medical History:  Diagnosis Date  . Back pain   . Cancer ([place])   . Cerebral palsy ([place])   . DDD (degenerative disc disease), lumbar   . Depression   . Depression   . GERD (gastroesophageal reflux disease)   . Mild cognitive disorder   . Other intervertebral disc degeneration, lumbar region      Past Surgical History:  Procedure Laterality Date  . TOTAL HIP ARTHROPLASTY      Social History   Socioeconomic History  . Marital status: Widowed    Spouse name: Not on file  . Number of children: Not on file  . Years of education: Not on file  . Highest education level: Not on file  Occupational History  . Not on file  Social Needs  . Financial resource strain: Not on file  . Food insecurity:    Worry: Not on file    Inability: Not on file  . Transportation needs:    Medical: Not on file    Non-medical: Not on file  Tobacco Use  . Smoking status: Former Research scientist (life sciences)  . Smokeless tobacco: Never Used  Substance and Sexual Activity  . Alcohol use: No  . Drug use: No  . Sexual activity: Not on file  Lifestyle  . Physical activity:    Days per week: Not on file    Minutes per session: Not on  file  . Stress: Not on file  Relationships  . Social connections:    Talks on phone: Not on file    Gets together: Not on file    Attends religious service: Not on file    Active member of club or organization: Not on file    Attends meetings of clubs or organizations: Not on file    Relationship status: Not on file  . Intimate partner violence:    Fear of current or ex partner: Not on file    Emotionally abused: Not on file    Physically abused: Not on file    Forced sexual activity: Not on file  Other Topics Concern  . Not on file  Social History Narrative  . Not on file     No family  history on file.   Current Facility-Administered Medications:  .  acetaminophen (TYLENOL) tablet 650 mg, 650 mg, Oral, Q6H PRN **OR** acetaminophen (TYLENOL) suppository 650 mg, 650 mg, Rectal, Q6H PRN, Lance Coon, MD, 650 mg at 05/02/18 0452 .  ceFEPIme (MAXIPIME) 2 g in sodium chloride 0.9 % 100 mL IVPB, 2 g, Intravenous, Q12H, Lance Coon, MD, Last Rate: 200 mL/hr at 05/02/18 1038, 2 g at 05/02/18 1038 .  dextrose 5 %-0.9 % sodium chloride infusion, , Intravenous, Continuous, Pyreddy, Pavan, MD .  ondansetron (ZOFRAN) tablet 4 mg, 4 mg, Oral, Q6H PRN **OR** ondansetron (ZOFRAN) injection 4 mg, 4 mg, Intravenous, Q6H PRN, Lance Coon, MD .  pantoprazole (PROTONIX) injection 40 mg, 40 mg, Intravenous, Q24H, Lance Coon, MD, 40 mg at 05/02/18 0029 .  vancomycin (VANCOCIN) IVPB 750 mg/150 ml premix, 750 mg, Intravenous, Q24H, Lance Coon, MD  Current Outpatient Medications:  .  acetaminophen (TYLENOL) 500 MG tablet, Take 500 mg by mouth 2 (two) times daily., Disp: , Rfl:  .  ARIPiprazole (ABILIFY) 2 MG tablet, Take 2 mg by mouth every Monday, Wednesday, and Friday. At bedtime., Disp: , Rfl:  .  aspirin EC 81 MG tablet, Take 81 mg by mouth daily., Disp: , Rfl:  .  bisacodyl (DULCOLAX) 5 MG EC tablet, Take 5 mg by mouth at bedtime., Disp: , Rfl:  .  Ca Carbonate-Mag Hydroxide (ROLAIDS PO), Take 1 tablet by mouth every 8 (eight) hours as needed. Chew and swallow 1 tablet by mouth every 8 hours as needed for acid reflux., Disp: , Rfl:  .  Ca Carbonate-Mag Hydroxide (ROLAIDS) 550-110 MG CHEW, Chew 2 tablets by mouth 3 (three) times daily. Chew and swallow 2 tablets before meals., Disp: , Rfl:  .  Carboxymethylcellulose Sodium (REFRESH LIQUIGEL) 1 % GEL, Place 2 drops into the right eye 3 (three) times daily as needed., Disp: , Rfl:  .  cetirizine (ZYRTEC) 10 MG tablet, Take 10 mg by mouth daily., Disp: , Rfl:  .  Cholecalciferol (D3-1000 PO), Take 1,000 Units by mouth daily., Disp: , Rfl:    .  docusate sodium (COLACE) 100 MG capsule, Take 200 mg by mouth daily. At 6 AM, Disp: , Rfl:  .  DULoxetine (CYMBALTA) 60 MG capsule, Take 60 mg by mouth daily. At 6 AM, Disp: , Rfl:  .  fluticasone (FLONASE) 50 MCG/ACT nasal spray, Place 1 spray into both nostrils daily., Disp: , Rfl:  .  gabapentin (NEURONTIN) 600 MG tablet, Take 600 mg by mouth at bedtime., Disp: , Rfl:  .  ibuprofen (ADVIL,MOTRIN) 600 MG tablet, Take 600 mg by mouth every 6 (six) hours as needed for pain. Or inflammation., Disp: , Rfl:  .  levothyroxine (SYNTHROID, LEVOTHROID) 100 MCG tablet, Take 100 mcg by mouth daily before breakfast., Disp: , Rfl:  .  Lidocaine 4 % PTCH, Apply 1 patch topically daily. Apply to right shoulder at 8 AM, leave on for 12 hours, then remove at 8 PM and leave off for 12 hours., Disp: , Rfl:  .  loperamide (IMODIUM A-D) 2 MG tablet, Take 2 mg by mouth every 4 (four) hours as needed for diarrhea or loose stools. Do not use for more than 48 hours consecutively., Disp: , Rfl:  .  magic mouthwash w/lidocaine SOLN, Take 15-30 mLs by mouth 3 (three) times daily. Take 15 - 30 ML by mouth 5-10 minutes before a meal. Use a straw and try to swallow directly without swishing around in mouth., Disp: , Rfl:  .  Melatonin 3 MG TABS, Take 3 mg by mouth at bedtime. At 8 PM, Disp: , Rfl:  .  memantine (NAMENDA XR) 28 MG CP24 24 hr capsule, Take 28 mg by mouth daily., Disp: , Rfl:  .  Menthol (ICY HOT BACK) 5 % PTCH, Apply 1 patch topically daily as needed. Remove at bedtime, Disp: , Rfl:  .  modafinil (PROVIGIL) 200 MG tablet, Take 200 mg by mouth daily. , Disp: , Rfl:  .  morphine (MS CONTIN) 15 MG 12 hr tablet, Take 1 tablet (15 mg total) by mouth every 12 (twelve) hours., Disp: 20 tablet, Rfl: 0 .  Multiple Vitamins-Minerals (CERTAVITE SENIOR/ANTIOXIDANT PO), Take 1 tablet by mouth daily. At 6 AM, Disp: , Rfl:  .  oxycodone (OXY-IR) 5 MG capsule, Take 5 mg by mouth every 6 (six) hours. At 08:00, 14:00, and 20:00  for 23 days., Disp: , Rfl:  .  pantoprazole (PROTONIX) 40 MG tablet, Take 1 tablet (40 mg total) by mouth daily. (Patient taking differently: Take 40 mg by mouth 2 (two) times daily. ), Disp: 30 tablet, Rfl: 0 .  Polyethyl Glycol-Propyl Glycol (SYSTANE) 0.4-0.3 % GEL ophthalmic gel, Place 1 drop into both eyes at bedtime., Disp: , Rfl:  .  polyethylene glycol (MIRALAX / GLYCOLAX) packet, Take 17 g by mouth every other day. , Disp: , Rfl:  .  polyethylene glycol (MIRALAX / GLYCOLAX) packet, Take 17 g by mouth every other day as needed (if no bowel movement in 48 hours). Mix 1 capful (17 grams) with 8 oz of fluid every other day as needed., Disp: , Rfl:  .  prochlorperazine (COMPAZINE) 10 MG tablet, Take 10 mg by mouth every 6 (six) hours as needed for nausea., Disp: , Rfl:  .  Propylene Glycol (SYSTANE COMPLETE) 0.6 % SOLN, Place 1 drop into both eyes 3 (three) times daily., Disp: , Rfl:  .  senna (SENOKOT) 8.6 MG TABS tablet, Take 1 tablet by mouth 2 (two) times daily., Disp: , Rfl:  .  simvastatin (ZOCOR) 20 MG tablet, Take 20 mg by mouth at bedtime., Disp: , Rfl:  .  tamsulosin (FLOMAX) 0.4 MG CAPS capsule, Take 0.4 mg by mouth daily after supper., Disp: , Rfl:  .  traMADol (ULTRAM) 50 MG tablet, Take 50 mg by mouth every 8 (eight) hours as needed for pain., Disp: , Rfl:  .  trolamine salicylate (ASPERCREME) 10 % cream, Apply 1 application topically 2 (two) times daily. To hands and right shoulder. Do not apply under [facility] patch., Disp: , Rfl:  .  ondansetron (ZOFRAN-ODT) 8 MG disintegrating tablet, Take 8 mg by mouth every 8 (eight) hours as needed for nausea.,  Disp: , Rfl:    Physical exam:  Vitals:   05/02/18 1041 05/02/18 1100 05/02/18 1130 05/02/18 1200  BP:  113/64 110/65 124/85  Pulse:  (!) 104 (!) 108 (!) 116  Resp:  (!) 24 (!) 35 19  Temp:      TempSrc:      SpO2: 95% 95% 91% 96%  Weight:      Height:       Physical Exam Constitutional:      Comments: Patient is thin and  cachectic  HENT:     Head: Normocephalic and atraumatic.  Eyes:     Pupils: Pupils are equal, round, and reactive to light.  Neck:     Musculoskeletal: Normal range of motion.  Cardiovascular:     Rate and Rhythm: Regular rhythm. Tachycardia present.     Heart sounds: Normal heart sounds.  Pulmonary:     Effort: Pulmonary effort is normal.     Breath sounds: Normal breath sounds.  Abdominal:     General: Bowel sounds are normal.     Palpations: Abdomen is soft.  Skin:    General: Skin is warm and dry.  Neurological:     Mental Status: He is alert.     Comments: Gait consistent with cerebral palsy        CMP Latest Ref Rng & Units 05/02/2018  Glucose 70 - 99 mg/dL 88  BUN 8 - 23 mg/dL 39(H)  Creatinine 0.61 - 1.24 mg/dL 1.46(H)  Sodium 135 - 145 mmol/L 155(H)  Potassium 3.5 - 5.1 mmol/L 4.5  Chloride 98 - 111 mmol/L 121(H)  CO2 22 - 32 mmol/L 25  Calcium 8.9 - 10.3 mg/dL 8.5(L)  Total Protein 6.5 - 8.1 g/dL -  Total Bilirubin 0.3 - 1.2 mg/dL -  Alkaline Phos 38 - 126 U/L -  AST 15 - 41 U/L -  ALT 0 - 44 U/L -   CBC Latest Ref Rng & Units 05/02/2018  WBC 4.0 - 10.5 K/uL 0.6(LL)  Hemoglobin 13.0 - 17.0 g/dL 8.0(L)  Hematocrit 39.0 - 52.0 % 28.4(L)  Platelets 150 - 400 K/uL 61(L)    @IMAGES @  Dg Lumbar Spine 2-3 Views  Result Date: 04/05/2018 CLINICAL DATA:  Acute low back pain following fall. Initial encounter. EXAM: LUMBAR SPINE - 2-3 VIEW COMPARISON:  08/12/2015 radiographs FINDINGS: Five non rib-bearing lumbar type vertebra are again identified with mild to moderate apex RIGHT lumbar scoliosis. Severe multilevel degenerative disc disease/spondylosis again noted. No acute fracture or new subluxation noted. Grade 1 spondylolisthesis L1-2 is unchanged. Irregularity of the anterior L1 vertebral body is again identified. IMPRESSION: 1. No acute bony abnormality. 2. Severe multilevel degenerative changes. Electronically Signed   By: Margarette Canada M.D.   On: 04/05/2018 15:13     Dg Elbow 2 Views Right  Result Date: 04/05/2018 CLINICAL DATA:  Acute RIGHT elbow pain following fall. Initial encounter. EXAM: RIGHT ELBOW - 2 VIEW COMPARISON:  None. FINDINGS: There is no evidence of fracture, dislocation, or joint effusion. There is no evidence of arthropathy or other focal bone abnormality. Soft tissues are unremarkable. IMPRESSION: Negative. Electronically Signed   By: Margarette Canada M.D.   On: 04/05/2018 15:11   Ct Head Wo Contrast  Result Date: 05/01/2018 CLINICAL DATA:  Syncopal episode.  Altered mental status EXAM: CT HEAD WITHOUT CONTRAST TECHNIQUE: Contiguous axial images were obtained from the base of the skull through the vertex without intravenous contrast. COMPARISON:  06/20/2017 FINDINGS: Brain: No acute intracranial abnormality. Specifically, no  hemorrhage, hydrocephalus, mass lesion, acute infarction, or significant intracranial injury. Vascular: No hyperdense vessel or unexpected calcification. Skull: No acute calvarial abnormality. Sinuses/Orbits: Visualized paranasal sinuses and mastoids clear. Orbital soft tissues unremarkable. Other: None IMPRESSION: No acute intracranial abnormality. Electronically Signed   By: Rolm Baptise M.D.   On: 05/01/2018 21:57   Ct Angio Chest Pe W And/or Wo Contrast  Result Date: 05/01/2018 CLINICAL DATA:  Weakness and hypoxia. EXAM: CT ANGIOGRAPHY CHEST WITH CONTRAST TECHNIQUE: Multidetector CT imaging of the chest was performed using the standard protocol during bolus administration of intravenous contrast. Multiplanar CT image reconstructions and MIPs were obtained to evaluate the vascular anatomy. CONTRAST:  67mL OMNIPAQUE IOHEXOL 350 MG/ML SOLN COMPARISON:  None. FINDINGS: Cardiovascular: Good opacification of the central and segmental pulmonary arteries. No focal filling defects. No evidence of significant pulmonary embolus. Normal heart size. No pericardial effusion. Normal caliber thoracic aorta. No aortic dissection. Great vessel  origins are patent. Mediastinum/Nodes: The esophagus is diffusely dilated and filled with fluid and ingested material. Dilatation extends to the esophagogastric junction. Differential diagnosis would include achalasia, distal stricture, or distal esophageal carcinoma. Mildly prominent lymph nodes noted at the EG junction may favor tumor. Consider upper endoscopy for further evaluation. Lungs/Pleura: Patchy infiltration and consolidation in the lung bases may represent aspiration or pneumonia. No pleural effusions. No pneumothorax. Airways are patent. Upper Abdomen: No acute abnormalities identified. Musculoskeletal: Severe degenerative changes in both shoulders with bilateral shoulder effusions, greater on the left. Degenerative changes in the spine. Review of the MIP images confirms the above findings. IMPRESSION: 1. No evidence of significant pulmonary embolus. 2. Dilated fluid-filled esophagus extending to the esophagogastric junction. Differential diagnosis would include achalasia, distal stricture, or distal esophageal carcinoma. Mild prominence of lymph nodes in the EG junction may favor tumor. Consider upper endoscopy for further evaluation. 3. Patchy infiltration and consolidation in the lung bases may represent aspiration or pneumonia. 4. Severe degenerative changes in both shoulders with bilateral shoulder effusions, greater on the left. Electronically Signed   By: Lucienne Capers M.D.   On: 05/01/2018 23:48   Dg Chest Portable 1 View  Result Date: 05/01/2018 CLINICAL DATA:  Syncope EXAM: PORTABLE CHEST 1 VIEW COMPARISON:  01/02/2018 FINDINGS: 2110 hours. Low volumes. Cardiopericardial silhouette is at upper limits of normal for size. Bibasilar atelectasis or infiltrate noted. The visualized bony structures of the thorax are intact. Degenerative changes evident in both shoulders. Telemetry leads overlie the chest. IMPRESSION: Low volume film with bibasilar atelectasis or infiltrate. Electronically  Signed   By: Misty Stanley M.D.   On: 05/01/2018 21:43   Dg Knee Complete 4 Views Left  Result Date: 04/05/2018 CLINICAL DATA:  Acute LEFT knee pain following fall. Initial encounter. EXAM: LEFT KNEE - COMPLETE 4+ VIEW COMPARISON:  None. FINDINGS: No evidence of fracture, dislocation, or joint effusion. No evidence of arthropathy or other focal bone abnormality. Soft tissues are unremarkable. IMPRESSION: Negative. Electronically Signed   By: Margarette Canada M.D.   On: 04/05/2018 15:13    Assessment and plan- Patient is a 73 y.o. male with history of GE junction adenocarcinoma T2N1 disease being treated with definitive concurrent chemoradiation at [facility] admitted for healthcare associated pneumonia complicated by pancytopenia  1.  Pancytopenia: Likely secondary to chemotherapy.  His white count was 1.6 on 04/18/2018 when he received his last cycle of chemotherapy.  His platelet counts have been trending down since then from 122 on 04/25/2018 to 58 on 04/28/2018.  Suspect that his anemia is also  secondary to chemotherapy causing prolonged myelosuppression.  Recommend checking ferritin iron studies B12 and folate at this time.  Also check daily differential.  I would hold off on giving any Neupogen at this time but if there is any further downward trend in his white count I would consider giving him daily Neupogen.  2.  Healthcare associated pneumonia: Currently on IV cefepime and vancomycin.  3.  Hypernatremia: He is getting IV hydration with D5 normal saline.  4.  Esophageal cancer: He is unlikely to receive any chemotherapy in the future and will follow up with [facility] oncology upon discharge   Thank you for this kind referral and the opportunity to participate in the care of this patient   Visit Diagnosis 1. Pancytopenia 2. Chemotherapy induced neutropenia 3. Health care associated pneumonia  Dr. Randa Evens, MD, MPH [facility] at [facility] [phone] 05/02/2018 4:24  PM

## 2018-05-02 NOTE — ED Notes (Signed)
5% dextrose in NS 1000 ml bag not on floor, supply chain notified and will bring some up.

## 2018-05-03 LAB — BASIC METABOLIC PANEL
BUN: 24 mg/dL — ABNORMAL HIGH (ref 8–23)
CO2: 27 mmol/L (ref 22–32)
Calcium: 9.5 mg/dL (ref 8.9–10.3)
Chloride: >130 mmol/L (ref 98–111)
Creatinine, Ser: 1.02 mg/dL (ref 0.61–1.24)
Glucose, Bld: 114 mg/dL — ABNORMAL HIGH (ref 70–99)
Potassium: 3.7 mmol/L (ref 3.5–5.1)
Sodium: 167 mmol/L (ref 135–145)

## 2018-05-03 LAB — FERRITIN: Ferritin: 315 ng/mL (ref 24–336)

## 2018-05-03 LAB — SODIUM
SODIUM: 165 mmol/L — AB (ref 135–145)
Sodium: 164 mmol/L (ref 135–145)
Sodium: 167 mmol/L (ref 135–145)

## 2018-05-03 LAB — IRON AND TIBC
Iron: 30 ug/dL — ABNORMAL LOW (ref 45–182)
Saturation Ratios: 25 % (ref 17.9–39.5)
TIBC: 122 ug/dL — ABNORMAL LOW (ref 250–450)
UIBC: 92 ug/dL

## 2018-05-03 LAB — CBC
HCT: 28.3 % — ABNORMAL LOW (ref 39.0–52.0)
Hemoglobin: 7.9 g/dL — ABNORMAL LOW (ref 13.0–17.0)
MCH: 25.7 pg — ABNORMAL LOW (ref 26.0–34.0)
MCHC: 27.9 g/dL — AB (ref 30.0–36.0)
MCV: 92.2 fL (ref 80.0–100.0)
Platelets: 71 10*3/uL — ABNORMAL LOW (ref 150–400)
RBC: 3.07 MIL/uL — ABNORMAL LOW (ref 4.22–5.81)
RDW: 19.9 % — ABNORMAL HIGH (ref 11.5–15.5)
WBC: 0.7 10*3/uL — CL (ref 4.0–10.5)
nRBC: 0 % (ref 0.0–0.2)

## 2018-05-03 LAB — VITAMIN B12: Vitamin B-12: 1041 pg/mL — ABNORMAL HIGH (ref 180–914)

## 2018-05-03 LAB — FOLATE: Folate: 31 ng/mL (ref >5.9)

## 2018-05-03 LAB — IMMATURE PLATELET FRACTION: Immature Platelet Fraction: 2 % (ref 1.2–8.6)

## 2018-05-03 MED ORDER — PIPERACILLIN-TAZOBACTAM 3.375 G IVPB
3.3750 g | Freq: Three times a day (TID) | INTRAVENOUS | Status: DC
  Administered 2018-05-03 – 2018-05-09 (×17): 3.375 g via INTRAVENOUS
  Filled 2018-05-03 (×19): qty 50

## 2018-05-03 MED ORDER — DEXTROSE 5 % IV SOLN
INTRAVENOUS | Status: DC
  Administered 2018-05-03 – 2018-05-07 (×9): via INTRAVENOUS

## 2018-05-03 MED ORDER — DEXTROSE-NACL 5-0.45 % IV SOLN: INTRAVENOUS | Status: DC

## 2018-05-03 NOTE — Evaluation (Addendum)
Clinical/Bedside Swallow Evaluation Patient Details  Name: Bob [name] MRN: [id number] Date of Birth: 1946-01-02  Today's Date: 05/03/2018 Time: SLP Start Time (ACUTE ONLY): 86 SLP Stop Time (ACUTE ONLY): 1020 SLP Time Calculation (min) (ACUTE ONLY): 60 min  Past Medical History:  Past Medical History:  Diagnosis Date  . Back pain   . Cancer ([place])   . Cerebral palsy ([place])   . DDD (degenerative disc disease), lumbar   . Depression   . Depression   . GERD (gastroesophageal reflux disease)   . Mild cognitive disorder   . Other intervertebral disc degeneration, lumbar region    Past Surgical History:  Past Surgical History:  Procedure Laterality Date  . 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 Workup reveals pneumonia, ongoing Regurgitation and vomiting of food/liquids w/ risk for aspiration, concern for aspiration, severe hypernatremia. Per report, family has noticed a great decrease in his eating for quite some time now and he has lost a great amount of weight over the last year. They note that his decline has increased since starting chemotherapy- which they expected- but they were hoping the chemotherapy would eventually improve his ability to take in food and enable him to regain his strength. Pt requires assistance w/ ADLs.  Assessment / Plan / Recommendation Clinical Impression  Pt appears to present w/ Moderate+  ororpharyngeal phase dysphagia w/ increased risk for aspiration. Pt was able to tolerate po trials of Nectar consistency liquids via TSP and 1/2 tsp trials of Puree w/ no immediate, overt s/s of aspiration when following aspiration precautions. However, oral phase deficits were noted including increased time, delayed A-P transit, min oral  residue, and apparent decreased lingual coordination for bolus cohesion and movement. During the pharyngeal phase, suspected delayed pharyngeal swallowing w/ thin liquids trials d/t more audible swallowing, seemingly less control of bolus, and coughing post trials. W/ Nectar consistency liquids, pt exhibited increased bolus management and only min throat clearing w/ TSP trials. Multiple swallows were still noted. Similar presentation w/ trials of Puree noted. Pt appeared to have the most pleasure and comfort despite the oral phase and pharyngeal phase dysphagia w/ tsp trials of Nectar liquids and purees; also few ice chips initially. No decline in overall respiratory presentation or status during/post these trial consistencies.  Of concern would be the volume of oral intake d/t the suspected poor Esophageal dysmotility secondary to the Cancer mass at the [employer]. Any Regurgitation of any food/liquid material increases risk for aspiration of such thus impacting Pulmonary status including pneumonia. Recommend f/u by Oncology and GI for further input.  After consulting MD, recommend initiation of a Dysphagia 1 diet w/ Nectar liquids; strict aspiration precautions; feeding support using TSP bolus feeding of Nectar liquids = Time b/t all bolus presentations. Pills Crushed in Puree. Will monitor for need for objective swallowing assessment next 1-2 days. NSG and family/pt updated, agreed.  SLP Visit Diagnosis: Dysphagia, oropharyngeal phase (R13.12)    Aspiration Risk  Moderate aspiration risk;Risk for inadequate nutrition/hydration    Diet Recommendation  Dysphagia level 1 w/ Nectar liquids BY TSP; strict aspiration precautions; feeding support w/ all po's  Medication Administration: Crushed with puree(for easier, safer swallowing)    Other  Recommendations Recommended Consults: (Dietician f/u) Oral Care Recommendations: Oral care BID;Staff/trained caregiver to provide oral care  Other Recommendations:  Order thickener from pharmacy;Prohibited food (jello, ice cream, thin soups);Remove water pitcher;Have oral suction available   Follow up Recommendations (TBD)      Frequency and Duration min 3x week  2 weeks       Prognosis Prognosis for Safe Diet Advancement: Fair Barriers to Reach Goals: Time post onset;Severity of deficits      Swallow Study   General Date of Onset: 05/01/18 HPI: 73 year old male patient with 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, GERD, cerebral palsy currently under hospitalist service for shortness of breath Type of Study: Bedside Swallow Evaluation Previous Swallow Assessment: unknown Diet Prior to this Study: NPO(regular diet prior per family report) Temperature Spikes Noted: Yes(wbc .7) Respiratory Status: Nasal cannula(4 liters) History of Recent Intubation: No Behavior/Cognition: Alert;Cooperative;Pleasant mood;Requires cueing(UEs affected by cerebral palsy) Oral Cavity Assessment: Dried secretions Oral Care Completed by SLP: Yes Oral Cavity - Dentition: Adequate natural dentition Vision: Functional for self-feeding Self-Feeding Abilities: Able to feed self;Needs assist;Needs set up;Total assist(affected by cerebral palsy) Patient Positioning: Upright in bed(needed positioning) Baseline Vocal Quality: Low vocal intensity(Dysphonia) Volitional Cough: Congested(nonproductive ) Volitional Swallow: Able to elicit(w/ time)    Oral/Motor/Sensory Function Overall Oral Motor/Sensory Function: Moderate impairment(cerebral palsy - tongue back position) Facial Symmetry: Within Functional Limits Lingual ROM: Within Functional Limits Lingual Symmetry: Within Functional Limits Lingual Strength: (Fair)   Ice Chips Ice chips: Within functional limits Presentation: Spoon(fed; 5 trials)   Thin Liquid Thin Liquid: Impaired Presentation: Cup;Self Fed(assisted fully; 4 trials) Oral Phase Impairments: Reduced labial seal;Reduced lingual  movement/coordination;Poor awareness of bolus Oral Phase Functional Implications: (reduced bolus control) Pharyngeal  Phase Impairments: Suspected delayed Swallow;Multiple swallows;Cough - Immediate(x2)    Nectar Thick Nectar Thick Liquid: Impaired(but improved w/ TSP presentation) Presentation: Spoon(fed - 7 trials; cup - self fed; 3 trials) Oral Phase Impairments: Reduced labial seal;Reduced lingual movement/coordination;Poor awareness of bolus Oral phase functional implications: (reduced bolus control) Pharyngeal Phase Impairments: Suspected delayed Swallow;Multiple swallows;Cough - Delayed;Throat Clearing - Delayed(w/ cup trials but NOT w/ TSP trials)   Honey Thick Honey Thick Liquid: Not tested   Puree Puree: Impaired Presentation: Spoon(fed; 10 trials) Oral Phase Impairments: Reduced labial seal;Reduced lingual movement/coordination;Poor awareness of bolus Oral Phase Functional Implications: Prolonged oral transit Pharyngeal Phase Impairments: Suspected delayed Swallow;Multiple swallows   Solid     Solid: Not tested      Orinda Kenner, MS, CCC-SLP [facility] 05/03/2018,3:09 PM

## 2018-05-03 NOTE — Progress Notes (Addendum)
[facility] [place] NAME: Bob [name]    MR#:  [id number]  DATE OF BIRTH:  [birth year]  SUBJECTIVE:  CHIEF COMPLAINT:   Chief Complaint  Patient presents with  . Shortness of Breath  . Altered Mental Status  Seen and evaluated today Has some cough Has difficulty breathing Generalized weakness On oxygen via nasal cannula at 4 L No fever  REVIEW OF SYSTEMS:    ROS  CONSTITUTIONAL: No documented fever. Has fatigue, weakness. No weight gain, no weight loss.  EYES: No blurry or double vision.  ENT: No tinnitus. No postnasal drip. No redness of the oropharynx.  RESPIRATORY: Has cough, no wheeze, no hemoptysis. Has dyspnea.  CARDIOVASCULAR: No chest pain. No orthopnea. No palpitations. No syncope.  GASTROINTESTINAL: No nausea, no vomiting or diarrhea. No abdominal pain. No melena or hematochezia.  GENITOURINARY: No dysuria or hematuria.  ENDOCRINE: No polyuria or nocturia. No heat or cold intolerance.  HEMATOLOGY: No anemia. No bruising. No bleeding.  INTEGUMENTARY: No rashes. No lesions.  MUSCULOSKELETAL: No arthritis. No swelling. No gout.  NEUROLOGIC: No numbness, tingling, or ataxia. No seizure-type activity.  PSYCHIATRIC: No anxiety. No insomnia. No ADD.   DRUG ALLERGIES:  No Known Allergies  VITALS:  Blood pressure 117/64, pulse (!) 117, temperature 100.3 F (37.9 C), temperature source Oral, resp. rate 19, height 5\' 7"  (1.702 m), weight 75.8 kg, SpO2 95 %.  PHYSICAL EXAMINATION:   Physical Exam  GENERAL:  73 y.o.-year-old patient lying in the bed oxygen via nasal cannula at 4 L EYES: Pupils equal, round, reactive to light and accommodation. No scleral icterus. Extraocular muscles intact.  HEENT: Head atraumatic, normocephalic. Oropharynx dry and nasopharynx clear.  NECK:  Supple, no jugular venous distention. No thyroid enlargement, no tenderness.  LUNGS: Decreased breath sounds bilaterally, bilateral Rales heard. No use  of accessory muscles of respiration.  CARDIOVASCULAR: S1, S2 tachycardia noted. No murmurs, rubs, or gallops.  ABDOMEN: Soft, nontender, nondistended. Bowel sounds present. No organomegaly or mass.  EXTREMITIES: No cyanosis, clubbing or edema b/l.    NEUROLOGIC: Cranial nerves II through XII are intact. No focal Motor or sensory deficits b/l.   PSYCHIATRIC: The patient is alert and oriented x 2.  SKIN: No obvious rash, lesion, or ulcer.   LABORATORY PANEL:   CBC Recent Labs  Lab 05/03/18 0547  WBC 0.7*  HGB 7.9*  HCT 28.3*  PLT 71*   ------------------------------------------------------------------------------------------------------------------ Chemistries  Recent Labs  Lab 05/01/18 2051  05/03/18 0547  NA 152*   < > 167*  K 4.4   < > 3.7  CL 115*   < > >130*  CO2 28   < > 27  GLUCOSE 116*   < > 114*  BUN 40*   < > 24*  CREATININE 1.89*   < > 1.02  CALCIUM 9.4   < > 9.5  AST 58*  --   --   ALT 25  --   --   ALKPHOS 55  --   --   BILITOT 0.5  --   --    < > = values in this interval not displayed.   ------------------------------------------------------------------------------------------------------------------  Cardiac Enzymes Recent Labs  Lab 05/01/18 2051  TROPONINI 0.07*   ------------------------------------------------------------------------------------------------------------------  RADIOLOGY:  Ct Head Wo Contrast  Result Date: 05/01/2018 CLINICAL DATA:  Syncopal episode.  Altered mental status EXAM: CT HEAD WITHOUT CONTRAST TECHNIQUE: Contiguous axial images were obtained from the base of the skull through the vertex  without intravenous contrast. COMPARISON:  06/20/2017 FINDINGS: Brain: No acute intracranial abnormality. Specifically, no hemorrhage, hydrocephalus, mass lesion, acute infarction, or significant intracranial injury. Vascular: No hyperdense vessel or unexpected calcification. Skull: No acute calvarial abnormality. Sinuses/Orbits: Visualized  paranasal sinuses and mastoids clear. Orbital soft tissues unremarkable. Other: None IMPRESSION: No acute intracranial abnormality. Electronically Signed   By: Rolm Baptise M.D.   On: 05/01/2018 21:57   Ct Angio Chest Pe W And/or Wo Contrast  Result Date: 05/01/2018 CLINICAL DATA:  Weakness and hypoxia. EXAM: CT ANGIOGRAPHY CHEST WITH CONTRAST TECHNIQUE: Multidetector CT imaging of the chest was performed using the standard protocol during bolus administration of intravenous contrast. Multiplanar CT image reconstructions and MIPs were obtained to evaluate the vascular anatomy. CONTRAST:  65mL OMNIPAQUE IOHEXOL 350 MG/ML SOLN COMPARISON:  None. FINDINGS: Cardiovascular: Good opacification of the central and segmental pulmonary arteries. No focal filling defects. No evidence of significant pulmonary embolus. Normal heart size. No pericardial effusion. Normal caliber thoracic aorta. No aortic dissection. Great vessel origins are patent. Mediastinum/Nodes: The esophagus is diffusely dilated and filled with fluid and ingested material. Dilatation extends to the esophagogastric junction. Differential diagnosis would include achalasia, distal stricture, or distal esophageal carcinoma. Mildly prominent lymph nodes noted at the EG junction may favor tumor. Consider upper endoscopy for further evaluation. Lungs/Pleura: Patchy infiltration and consolidation in the lung bases may represent aspiration or pneumonia. No pleural effusions. No pneumothorax. Airways are patent. Upper Abdomen: No acute abnormalities identified. Musculoskeletal: Severe degenerative changes in both shoulders with bilateral shoulder effusions, greater on the left. Degenerative changes in the spine. Review of the MIP images confirms the above findings. IMPRESSION: 1. No evidence of significant pulmonary embolus. 2. Dilated fluid-filled esophagus extending to the esophagogastric junction. Differential diagnosis would include achalasia, distal  stricture, or distal esophageal carcinoma. Mild prominence of lymph nodes in the EG junction may favor tumor. Consider upper endoscopy for further evaluation. 3. Patchy infiltration and consolidation in the lung bases may represent aspiration or pneumonia. 4. Severe degenerative changes in both shoulders with bilateral shoulder effusions, greater on the left. Electronically Signed   By: Lucienne Capers M.D.   On: 05/01/2018 23:48   Dg Chest Portable 1 View  Result Date: 05/01/2018 CLINICAL DATA:  Syncope EXAM: PORTABLE CHEST 1 VIEW COMPARISON:  01/02/2018 FINDINGS: 2110 hours. Low volumes. Cardiopericardial silhouette is at upper limits of normal for size. Bibasilar atelectasis or infiltrate noted. The visualized bony structures of the thorax are intact. Degenerative changes evident in both shoulders. Telemetry leads overlie the chest. IMPRESSION: Low volume film with bibasilar atelectasis or infiltrate. Electronically Signed   By: Misty Stanley M.D.   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, GERD, cerebral palsy currently under hospitalist service for shortness of breath  -Sepsis secondary to pneumonia improving slowly Broad-spectrum IV antibiotics that is IV vancomycin and cefepime antibiotics Follow-up cultures  IV vancomycin and cefepime antibiotic on board  -Acute hyponatremia Change IV fluids to IV D5W Serial sodium monitoring Nephrology evaluation  -Healthcare associated pneumonia Follow-up WBC count and cultures IV vancomycin and IV cefepime antibiotics  -Pancytopenia secondary to recent chemotherapy Oncology evaluation and neutropenic precautions No Neupogen recommended at this time Monitor counts  -Esophageal cancer Oncology follow-up No further chemotherapy recommended Further follow-up with [facility] oncology  -Dysphagia secondary to esophageal cancer Risk of aspiration  present Currently n.p.o. Will advance to pured diet once patient starts tolerating  -  Cerebral palsy Supportive care   All the records are reviewed and case discussed with Care Management/Social Worker. Management plans discussed with the patient, family and they are in agreement.  CODE STATUS: Full code  DVT Prophylaxis: SCDs  TOTAL TIME TAKING CARE OF THIS PATIENT: 36 minutes.   POSSIBLE D/C IN 2 to 3 DAYS, DEPENDING ON CLINICAL CONDITION.  Saundra Shelling M.D on 05/03/2018 at 10:24 AM  Between 7am to 6pm - Pager - [phone]  After 6pm go to [email or web address] - password [email or web address] [facility]  Office  [phone]  CC: Primary care physician; Patient, No Pcp Per  Note: This dictation was prepared with Dragon dictation along with smaller phrase technology. Any transcriptional errors that result from this process are unintentional.

## 2018-05-03 NOTE — Clinical Social Work Note (Signed)
Clinical Social Work Assessment  Patient Details  Name: Bob [name] MRN: [id number] Date of Birth: 1946-02-09  Date of referral:  05/03/18               Reason for consult:  Facility Placement                Permission sought to share information with:  Case Manager, Customer service manager, Family Supports Permission granted to share information::  Yes, Verbal Permission Granted  Name::        Agency::     Relationship::     Contact Information:     Housing/Transportation Living arrangements for the past 2 months:  [place] of Information:  Other (Comment Required)(Patient's cousin ) Patient Interpreter Needed:  None Criminal Activity/Legal Involvement Pertinent to Current Situation/Hospitalization:  No - Comment as needed Significant Relationships:  Siblings, Other Family Members Lives with:  Facility Resident Do you feel safe going back to the place where you live?  Yes Need for family participation in patient care:  Yes (Comment)  Care giving concerns:  Patient lives at [facility] assisted living    Social Worker assessment / plan:  CSW consulted for facility placement. CSW met with patient and his cousin Earline at bedside. Patient has slow slurred speech and his cousin did most of the speaking for him. Cousin states that patient has lived at [facility] since July of 2016. Cousin reports that she is the closest family member to patient. Per cousin patients son and daughter in law live in [place].  Patient would like to return to assisted living but currently he is requiring 4 liters of oxygen acute and IV antibiotics. CSW explained that PT would eval patient and would help determine level of care needed. CSW explained that patient may have to go to SNF for rehab before returning to [facility]. Patient states understanding. CSW will follow for transition of care.   Employment status:  Retired Office manager PT Recommendations:  Not assessed at this time Information / Referral to community resources:  [place]  Patient/Family's Response to care:  Patient and cousin thanked CSW for assistance  Patient/Family's Understanding of and Emotional Response to Diagnosis, Current Treatment, and Prognosis:  Patient in agreement with current plan   Emotional Assessment Appearance:  Appears stated age Attitude/Demeanor/Rapport:    Affect (typically observed):  Accepting, Quiet, Pleasant Orientation:  Oriented to Self, Oriented to Place Alcohol / Substance use:  Not Applicable Psych involvement (Current and /or in the community):  No (Comment)  Discharge Needs  Concerns to be addressed:  Discharge Planning Concerns Readmission within the last 30 days:  No Current discharge risk:  None Barriers to Discharge:  Continued Medical Work up   [employer], [place] 05/03/2018, 11:20 AM

## 2018-05-03 NOTE — Consult Note (Signed)
[facility] CONSULT NOTE    Date: 05/03/2018                  Patient Name:  Bob [name]  MRN: [id number]  DOB: 1945-12-17  Age / Sex: 73 y.o., male         PCP: Patient, No Pcp Per                 Service Requesting Consult: Hospitalist                 Reason for Consult: Hypernatremia            History of Present Illness: Patient is a 73 y.o. male with a PMHx of cerebral palsy, lumbar degenerative disc disease, depression, GERD, esophageal cancer, who was admitted to [facility] on 05/01/2018 for evaluation of shortness of breath and altered mental status.  Patient unable to provide significant history at this point in time.  We are asked to see him for evaluation management of severe hypernatremia.  Upon presentation his serum sodium was initially 155.  Serum sodium then climbed to 167.  He has been started on D5W and sodium now down to 164.  He is significantly dry oral mucosa.  Patient has known history of esophageal cancer followed by oncology.   Medications: Outpatient medications: Medications Prior to Admission  Medication Sig Dispense Refill Last Dose  . acetaminophen (TYLENOL) 500 MG tablet Take 500 mg by mouth 2 (two) times daily.   PRN at PRN  . ARIPiprazole (ABILIFY) 2 MG tablet Take 2 mg by mouth every Monday, Wednesday, and Friday. At bedtime.   06/30/2017 at Unknown time  . aspirin EC 81 MG tablet Take 81 mg by mouth daily.   06/30/2017 at Unknown time  . bisacodyl (DULCOLAX) 5 MG EC tablet Take 5 mg by mouth at bedtime.   06/29/2017 at Unknown time  . Ca Carbonate-Mag Hydroxide (ROLAIDS PO) Take 1 tablet by mouth every 8 (eight) hours as needed. Chew and swallow 1 tablet by mouth every 8 hours as needed for acid reflux.     . Ca Carbonate-Mag Hydroxide (ROLAIDS) 550-110 MG CHEW Chew 2 tablets by mouth 3 (three) times daily. Chew and swallow 2 tablets before meals.     . Carboxymethylcellulose Sodium (REFRESH LIQUIGEL) 1 % GEL Place 2 drops into the right  eye 3 (three) times daily as needed.     . cetirizine (ZYRTEC) 10 MG tablet Take 10 mg by mouth daily.   06/30/2017 at Unknown time  . Cholecalciferol (D3-1000 PO) Take 1,000 Units by mouth daily.   06/30/2017 at Unknown time  . docusate sodium (COLACE) 100 MG capsule Take 200 mg by mouth daily. At 6 AM   06/30/2017 at Unknown time  . DULoxetine (CYMBALTA) 60 MG capsule Take 60 mg by mouth daily. At 6 AM   06/30/2017 at Unknown time  . fluticasone (FLONASE) 50 MCG/ACT nasal spray Place 1 spray into both nostrils daily.     Marland Kitchen gabapentin (NEURONTIN) 600 MG tablet Take 600 mg by mouth at bedtime.   06/29/2017 at Unknown time  . ibuprofen (ADVIL,MOTRIN) 600 MG tablet Take 600 mg by mouth every 6 (six) hours as needed for pain. Or inflammation.     Marland Kitchen levothyroxine (SYNTHROID, LEVOTHROID) 100 MCG tablet Take 100 mcg by mouth daily before breakfast.   06/30/2017 at Unknown time  . Lidocaine 4 % PTCH Apply 1 patch topically daily. Apply to right shoulder at 8 AM, leave  on for 12 hours, then remove at 8 PM and leave off for 12 hours.     Marland Kitchen loperamide (IMODIUM A-D) 2 MG tablet Take 2 mg by mouth every 4 (four) hours as needed for diarrhea or loose stools. Do not use for more than 48 hours consecutively.     . magic mouthwash w/lidocaine SOLN Take 15-30 mLs by mouth 3 (three) times daily. Take 15 - 30 ML by mouth 5-10 minutes before a meal. Use a straw and try to swallow directly without swishing around in mouth.     . Melatonin 3 MG TABS Take 3 mg by mouth at bedtime. At 8 PM   06/29/2017 at Unknown time  . memantine (NAMENDA XR) 28 MG CP24 24 hr capsule Take 28 mg by mouth daily.   06/30/2017 at Unknown time  . Menthol (ICY HOT BACK) 5 % PTCH Apply 1 patch topically daily as needed. Remove at bedtime   PRN at PRN  . modafinil (PROVIGIL) 200 MG tablet Take 200 mg by mouth daily.    Past Week at Unknown time  . morphine (MS CONTIN) 15 MG 12 hr tablet Take 1 tablet (15 mg total) by mouth every 12 (twelve) hours. 20  tablet 0 06/30/2017 at Unknown time  . Multiple Vitamins-Minerals (CERTAVITE SENIOR/ANTIOXIDANT PO) Take 1 tablet by mouth daily. At 6 AM   06/30/2017 at Unknown time  . oxycodone (OXY-IR) 5 MG capsule Take 5 mg by mouth every 6 (six) hours. At 08:00, 14:00, and 20:00 for 23 days.     . pantoprazole (PROTONIX) 40 MG tablet Take 1 tablet (40 mg total) by mouth daily. (Patient taking differently: Take 40 mg by mouth 2 (two) times daily. ) 30 tablet 0   . Polyethyl Glycol-Propyl Glycol (SYSTANE) 0.4-0.3 % GEL ophthalmic gel Place 1 drop into both eyes at bedtime.     . polyethylene glycol (MIRALAX / GLYCOLAX) packet Take 17 g by mouth every other day.    06/30/2017 at Unknown time  . polyethylene glycol (MIRALAX / GLYCOLAX) packet Take 17 g by mouth every other day as needed (if no bowel movement in 48 hours). Mix 1 capful (17 grams) with 8 oz of fluid every other day as needed.     . prochlorperazine (COMPAZINE) 10 MG tablet Take 10 mg by mouth every 6 (six) hours as needed for nausea.     . Propylene Glycol (SYSTANE COMPLETE) 0.6 % SOLN Place 1 drop into both eyes 3 (three) times daily.     Marland Kitchen senna (SENOKOT) 8.6 MG TABS tablet Take 1 tablet by mouth 2 (two) times daily.     . simvastatin (ZOCOR) 20 MG tablet Take 20 mg by mouth at bedtime.   06/29/2017 at Unknown time  . tamsulosin (FLOMAX) 0.4 MG CAPS capsule Take 0.4 mg by mouth daily after supper.   06/29/2017 at Unknown time  . traMADol (ULTRAM) 50 MG tablet Take 50 mg by mouth every 8 (eight) hours as needed for pain.     Marland Kitchen trolamine salicylate (ASPERCREME) 10 % cream Apply 1 application topically 2 (two) times daily. To hands and right shoulder. Do not apply under [facility] patch.   PRN at PRN  . ondansetron (ZOFRAN-ODT) 8 MG disintegrating tablet Take 8 mg by mouth every 8 (eight) hours as needed for nausea.       Current medications: Current Facility-Administered Medications  Medication Dose Route Frequency Provider Last Rate Last Dose  .  acetaminophen (TYLENOL) tablet 650 mg  650 mg Oral Q6H PRN Lance Coon, MD   650 mg at 05/03/18 0515   Or  . acetaminophen (TYLENOL) suppository 650 mg  650 mg Rectal Q6H PRN Lance Coon, MD   650 mg at 05/02/18 0452  . dextrose 5 % solution   Intravenous Continuous Saundra Shelling, MD 125 mL/hr at 05/03/18 1441    . MEDLINE mouth rinse  15 mL Mouth Rinse q12n4p Pyreddy, Reatha Harps, MD   15 mL at 05/03/18 1122  . ondansetron (ZOFRAN) tablet 4 mg  4 mg Oral Q6H PRN Lance Coon, MD       Or  . ondansetron [facility]) injection 4 mg  4 mg Intravenous Q6H PRN Lance Coon, MD   4 mg at 05/02/18 1754  . pantoprazole (PROTONIX) injection 40 mg  40 mg Intravenous Q24H Lance Coon, MD   40 mg at 05/03/18 0047  . piperacillin-tazobactam (ZOSYN) IVPB 3.375 g  3.375 g Intravenous Q8H Pyreddy, Reatha Harps, MD          Allergies: No Known Allergies    Past Medical History: Past Medical History:  Diagnosis Date  . Back pain   . Cancer ([place])   . Cerebral palsy ([place])   . DDD (degenerative disc disease), lumbar   . Depression   . Depression   . GERD (gastroesophageal reflux disease)   . Mild cognitive disorder   . Other intervertebral disc degeneration, lumbar region      Past Surgical History: Past Surgical History:  Procedure Laterality Date  . TOTAL HIP ARTHROPLASTY       Family History: History reviewed. No pertinent family history.   Social History: Social History   Socioeconomic History  . Marital status: Widowed    Spouse name: Not on file  . Number of children: Not on file  . Years of education: Not on file  . Highest education level: Not on file  Occupational History  . Not on file  Social Needs  . Financial resource strain: Not on file  . Food insecurity:    Worry: Not on file    Inability: Not on file  . Transportation needs:    Medical: Not on file    Non-medical: Not on file  Tobacco Use  . Smoking status: Former Research scientist (life sciences)  . Smokeless tobacco: Never Used   Substance and Sexual Activity  . Alcohol use: No  . Drug use: No  . Sexual activity: Not on file  Lifestyle  . Physical activity:    Days per week: Not on file    Minutes per session: Not on file  . Stress: Not on file  Relationships  . Social connections:    Talks on phone: Not on file    Gets together: Not on file    Attends religious service: Not on file    Active member of club or organization: Not on file    Attends meetings of clubs or organizations: Not on file    Relationship status: Not on file  . Intimate partner violence:    Fear of current or ex partner: Not on file    Emotionally abused: Not on file    Physically abused: Not on file    Forced sexual activity: Not on file  Other Topics Concern  . Not on file  Social History Narrative  . Not on file     Review of Systems: Patient unable to provide review of  systems given difficulties with speech.  Vital Signs: Blood pressure 117/75, pulse 94, temperature 98.1  F (36.7 C), temperature source Oral, resp. rate 18, height 5\' 7"  (1.702 m), weight 75.8 kg, SpO2 98 %.  Weight trends: Filed Weights   05/01/18 2047  Weight: 75.8 kg    Physical Exam: General: NAD, resting in bed  Head: Normocephalic, atraumatic.  Eyes: Anicteric, EOMI  Nose: Mucous membranes dry, not inflammed, nonerythematous.  Throat: Oropharynx nonerythematous, OM dry  Neck: Supple, trachea midline.  Lungs:  Normal respiratory effort. Clear to auscultation BL without crackles or wheezes.  Heart: RRR. S1 and S2 normal without gallop, murmur, or rubs.  Abdomen:  BS normoactive. Soft, Nondistended, non-tender.  No masses or organomegaly.  Extremities: No pretibial edema.  Neurologic: Awake, garbled speech  Skin: No visible rashes, scars.    Lab results: Basic Metabolic Panel: Recent Labs  Lab 05/01/18 2051 05/02/18 0458 05/03/18 0547 05/03/18 1148  NA 152* 155* 167* 164*  K 4.4 4.5 3.7  --   CL 115* 121* >130*  --   CO2 28 25 27    --   GLUCOSE 116* 88 114*  --   BUN 40* 39* 24*  --   CREATININE 1.89* 1.46* 1.02  --   CALCIUM 9.4 8.5* 9.5  --     Liver Function Tests: Recent Labs  Lab 05/01/18 2051  AST 58*  ALT 25  ALKPHOS 55  BILITOT 0.5  PROT 6.9  ALBUMIN 3.1*   No results for input(s): LIPASE, AMYLASE in the last 168 hours. No results for input(s): AMMONIA in the last 168 hours.  CBC: Recent Labs  Lab 05/01/18 2051 05/02/18 0458 05/03/18 0547  WBC 0.6* 0.6*  0.6* 0.7*  NEUTROABS  --  0.5*  --   HGB 9.7* 8.0*  8.0* 7.9*  HCT 33.3* 28.4*  27.7* 28.3*  MCV 89.3 92.8  90.2 92.2  PLT 83* 61*  69* 71*    Cardiac Enzymes: Recent Labs  Lab 05/01/18 2051  TROPONINI 0.07*    BNP: Invalid input(s): POCBNP  CBG: No results for input(s): GLUCAP in the last 168 hours.  Microbiology: Results for orders placed or performed during the hospital encounter of 05/01/18  Blood culture (routine x 2)     Status: None (Preliminary result)   Collection Time: 05/01/18 10:36 PM  Result Value Ref Range Status   Specimen Description BLOOD LEFT HAND  Final   Special Requests   Final    BOTTLES DRAWN AEROBIC ONLY Blood Culture results may not be optimal due to an inadequate volume of blood received in culture bottles   Culture   Final    NO GROWTH 2 DAYS Performed at [facility], [address], [place] [address]    Report Status PENDING  Incomplete  Blood culture (routine x 2)     Status: None (Preliminary result)   Collection Time: 05/01/18 10:45 PM  Result Value Ref Range Status   Specimen Description BLOOD RIGHT HAND  Final   Special Requests   Final    BOTTLES DRAWN AEROBIC AND ANAEROBIC Blood Culture adequate volume   Culture   Final    NO GROWTH 2 DAYS Performed at [facility], [address] [place], [place] [address]    Report Status PENDING  Incomplete  MRSA PCR Screening     Status: None   Collection Time: 05/02/18  5:05 PM  Result Value Ref Range  Status   MRSA by PCR NEGATIVE NEGATIVE Final    Comment:        The GeneXpert MRSA  Assay (FDA approved for NASAL specimens only), is one component of a comprehensive MRSA colonization surveillance program. It is not intended to diagnose MRSA infection nor to guide or monitor treatment for MRSA infections. Performed at [facility], [address] [place], [place] [address]     Coagulation Studies: No results for input(s): LABPROT, INR in the last 72 hours.  Urinalysis: Recent Labs    05/01/18 2116  COLORURINE YELLOW*  LABSPEC 1.016  PHURINE 5.0  GLUCOSEU NEGATIVE  HGBUR SMALL*  BILIRUBINUR NEGATIVE  KETONESUR NEGATIVE  PROTEINUR 30*  NITRITE NEGATIVE  LEUKOCYTESUR NEGATIVE      Imaging: Ct Head Wo Contrast  Result Date: 05/01/2018 CLINICAL DATA:  Syncopal episode.  Altered mental status EXAM: CT HEAD WITHOUT CONTRAST TECHNIQUE: Contiguous axial images were obtained from the base of the skull through the vertex without intravenous contrast. COMPARISON:  06/20/2017 FINDINGS: Brain: No acute intracranial abnormality. Specifically, no hemorrhage, hydrocephalus, mass lesion, acute infarction, or significant intracranial injury. Vascular: No hyperdense vessel or unexpected calcification. Skull: No acute calvarial abnormality. Sinuses/Orbits: Visualized paranasal sinuses and mastoids clear. Orbital soft tissues unremarkable. Other: None IMPRESSION: No acute intracranial abnormality. Electronically Signed   By: Rolm Baptise M.D.   On: 05/01/2018 21:57   Ct Angio Chest Pe W And/or Wo Contrast  Result Date: 05/01/2018 CLINICAL DATA:  Weakness and hypoxia. EXAM: CT ANGIOGRAPHY CHEST WITH CONTRAST TECHNIQUE: Multidetector CT imaging of the chest was performed using the standard protocol during bolus administration of intravenous contrast. Multiplanar CT image reconstructions and MIPs were obtained to evaluate the vascular anatomy. CONTRAST:  84mL OMNIPAQUE IOHEXOL 350 MG/ML  SOLN COMPARISON:  None. FINDINGS: Cardiovascular: Good opacification of the central and segmental pulmonary arteries. No focal filling defects. No evidence of significant pulmonary embolus. Normal heart size. No pericardial effusion. Normal caliber thoracic aorta. No aortic dissection. Great vessel origins are patent. Mediastinum/Nodes: The esophagus is diffusely dilated and filled with fluid and ingested material. Dilatation extends to the esophagogastric junction. Differential diagnosis would include achalasia, distal stricture, or distal esophageal carcinoma. Mildly prominent lymph nodes noted at the EG junction may favor tumor. Consider upper endoscopy for further evaluation. Lungs/Pleura: Patchy infiltration and consolidation in the lung bases may represent aspiration or pneumonia. No pleural effusions. No pneumothorax. Airways are patent. Upper Abdomen: No acute abnormalities identified. Musculoskeletal: Severe degenerative changes in both shoulders with bilateral shoulder effusions, greater on the left. Degenerative changes in the spine. Review of the MIP images confirms the above findings. IMPRESSION: 1. No evidence of significant pulmonary embolus. 2. Dilated fluid-filled esophagus extending to the esophagogastric junction. Differential diagnosis would include achalasia, distal stricture, or distal esophageal carcinoma. Mild prominence of lymph nodes in the EG junction may favor tumor. Consider upper endoscopy for further evaluation. 3. Patchy infiltration and consolidation in the lung bases may represent aspiration or pneumonia. 4. Severe degenerative changes in both shoulders with bilateral shoulder effusions, greater on the left. Electronically Signed   By: Lucienne Capers M.D.   On: 05/01/2018 23:48   Dg Chest Portable 1 View  Result Date: 05/01/2018 CLINICAL DATA:  Syncope EXAM: PORTABLE CHEST 1 VIEW COMPARISON:  01/02/2018 FINDINGS: 2110 hours. Low volumes. Cardiopericardial silhouette is at  upper limits of normal for size. Bibasilar atelectasis or infiltrate noted. The visualized bony structures of the thorax are intact. Degenerative changes evident in both shoulders. Telemetry leads overlie the chest. IMPRESSION: Low volume film with bibasilar atelectasis or infiltrate. Electronically Signed   By: Verda Cumins.D.  On: 05/01/2018 21:43      Assessment & Plan: Pt is a 73 y.o. male with a PMHx of cerebral palsy, lumbar degenerative disc disease, depression, GERD, esophageal cancer, who was admitted to [facility] on 05/01/2018 for evaluation of shortness of breath and altered mental status.    1.  Severe hypernatremia. 2.  Acute renal failure, improving. 3.  Esophogeal cancer.   Plan:  The patient had severe hyponatremia upon admission.  Sodium did rise to 167 now down to 164.  Increase D5W to 150 cc per hour.  Continue to monitor serum sodium as being performed.  Acute renal failure appears to be improving with IV fluid hydration.  Appreciate oncology input as well.  Further plan as patient progresses.

## 2018-05-03 NOTE — Progress Notes (Addendum)
Hematology/Oncology Consult note [facility]  Telephone:([phone] [phone]  Patient Care Team: Patient, No Pcp Per as PCP - General (General Practice)   Name of the patient: Bob [name]  [id number]  21-Nov-1945   Date of visit: 05/03/2018  Interval history- appears more alert today but still somewhat confused  ECOG PS- 3 Pain scale- 0   Review of systems- limited as patient is confused    Review of Systems  Constitutional: Positive for malaise/fatigue. Negative for chills, fever and weight loss.  HENT: Negative for congestion, ear discharge and nosebleeds.   Eyes: Negative for blurred vision.  Respiratory: Negative for cough, hemoptysis, sputum production, shortness of breath and wheezing.   Cardiovascular: Negative for chest pain, palpitations, orthopnea and claudication.  Gastrointestinal: Negative for abdominal pain, blood in stool, constipation, diarrhea, heartburn, melena, nausea and vomiting.  Genitourinary: Negative for dysuria, flank pain, frequency, hematuria and urgency.  Musculoskeletal: Negative for back pain, joint pain and myalgias.  Skin: Negative for rash.  Neurological: Negative for dizziness, tingling, focal weakness, seizures, weakness and headaches.  Endo/Heme/Allergies: Does not bruise/bleed easily.  Psychiatric/Behavioral: Negative for depression and suicidal ideas. The patient does not have insomnia.        No Known Allergies   Past Medical History:  Diagnosis Date  . Back pain   . Cancer ([place])   . Cerebral palsy ([place])   . DDD (degenerative disc disease), lumbar   . Depression   . Depression   . GERD (gastroesophageal reflux disease)   . Mild cognitive disorder   . Other intervertebral disc degeneration, lumbar region      Past Surgical History:  Procedure Laterality Date  . TOTAL HIP ARTHROPLASTY      Social History   Socioeconomic History  . Marital status: Widowed    Spouse name: Not on  file  . Number of children: Not on file  . Years of education: Not on file  . Highest education level: Not on file  Occupational History  . Not on file  Social Needs  . Financial resource strain: Not on file  . Food insecurity:    Worry: Not on file    Inability: Not on file  . Transportation needs:    Medical: Not on file    Non-medical: Not on file  Tobacco Use  . Smoking status: Former Research scientist (life sciences)  . Smokeless tobacco: Never Used  Substance and Sexual Activity  . Alcohol use: No  . Drug use: No  . Sexual activity: Not on file  Lifestyle  . Physical activity:    Days per week: Not on file    Minutes per session: Not on file  . Stress: Not on file  Relationships  . Social connections:    Talks on phone: Not on file    Gets together: Not on file    Attends religious service: Not on file    Active member of club or organization: Not on file    Attends meetings of clubs or organizations: Not on file    Relationship status: Not on file  . Intimate partner violence:    Fear of current or ex partner: Not on file    Emotionally abused: Not on file    Physically abused: Not on file    Forced sexual activity: Not on file  Other Topics Concern  . Not on file  Social History Narrative  . Not on file    History reviewed. No pertinent family history.  Current Facility-Administered Medications:  .  acetaminophen (TYLENOL) tablet 650 mg, 650 mg, Oral, Q6H PRN, 650 mg at 05/03/18 0515 **OR** acetaminophen (TYLENOL) suppository 650 mg, 650 mg, Rectal, Q6H PRN, Lance Coon, MD, 650 mg at 05/02/18 0452 .  dextrose 5 % solution, , Intravenous, Continuous, Lateef, Munsoor, MD, Last Rate: 125 mL/hr at 05/03/18 1441 .  MEDLINE mouth rinse, 15 mL, Mouth Rinse, q12n4p, Pyreddy, Pavan, MD, 15 mL at 05/03/18 1122 .  ondansetron (ZOFRAN) tablet 4 mg, 4 mg, Oral, Q6H PRN **OR** ondansetron (ZOFRAN) injection 4 mg, 4 mg, Intravenous, Q6H PRN, Lance Coon, MD, 4 mg at 05/02/18 1754 .   pantoprazole (PROTONIX) injection 40 mg, 40 mg, Intravenous, Q24H, Lance Coon, MD, 40 mg at 05/03/18 0047 .  piperacillin-tazobactam (ZOSYN) IVPB 3.375 g, 3.375 g, Intravenous, Q8H, Pyreddy, Reatha Harps, MD  Physical exam:  Vitals:   05/02/18 1959 05/03/18 0430 05/03/18 0949 05/03/18 1511  BP: 109/64 117/64  117/75  Pulse: (!) 108 (!) 117  94  Resp: 20 19  18   Temp: 98.7 F (37.1 C) 100.3 F (37.9 C)  98.1 F (36.7 C)  TempSrc: Oral Oral  Oral  SpO2: 92% 94% 95% 98%  Weight:      Height:       Physical Exam Constitutional:      General: He is not in acute distress. HENT:     Head: Normocephalic and atraumatic.  Eyes:     Pupils: Pupils are equal, round, and reactive to light.  Neck:     Musculoskeletal: Normal range of motion.  Cardiovascular:     Rate and Rhythm: Regular rhythm. Tachycardia present.     Heart sounds: Normal heart sounds.  Pulmonary:     Effort: Pulmonary effort is normal.     Breath sounds: Normal breath sounds.  Abdominal:     General: Bowel sounds are normal.     Palpations: Abdomen is soft.  Skin:    General: Skin is warm and dry.  Neurological:     Mental Status: He is alert.     Comments: Oriented to self      CMP Latest Ref Rng & Units 05/03/2018  Glucose 70 - 99 mg/dL -  BUN 8 - 23 mg/dL -  Creatinine 0.61 - 1.24 mg/dL -  Sodium 135 - 145 mmol/L 164(HH)  Potassium 3.5 - 5.1 mmol/L -  Chloride 98 - 111 mmol/L -  CO2 22 - 32 mmol/L -  Calcium 8.9 - 10.3 mg/dL -  Total Protein 6.5 - 8.1 g/dL -  Total Bilirubin 0.3 - 1.2 mg/dL -  Alkaline Phos 38 - 126 U/L -  AST 15 - 41 U/L -  ALT 0 - 44 U/L -   CBC Latest Ref Rng & Units 05/03/2018  WBC 4.0 - 10.5 K/uL 0.7(LL)  Hemoglobin 13.0 - 17.0 g/dL 7.9(L)  Hematocrit 39.0 - 52.0 % 28.3(L)  Platelets 150 - 400 K/uL 71(L)    @IMAGES @  Dg Lumbar Spine 2-3 Views  Result Date: 04/05/2018 CLINICAL DATA:  Acute low back pain following fall. Initial encounter. EXAM: LUMBAR SPINE - 2-3 VIEW  COMPARISON:  08/12/2015 radiographs FINDINGS: Five non rib-bearing lumbar type vertebra are again identified with mild to moderate apex RIGHT lumbar scoliosis. Severe multilevel degenerative disc disease/spondylosis again noted. No acute fracture or new subluxation noted. Grade 1 spondylolisthesis L1-2 is unchanged. Irregularity of the anterior L1 vertebral body is again identified. IMPRESSION: 1. No acute bony abnormality. 2. Severe multilevel degenerative changes. Electronically Signed  By: Margarette Canada M.D.   On: 04/05/2018 15:13   Dg Elbow 2 Views Right  Result Date: 04/05/2018 CLINICAL DATA:  Acute RIGHT elbow pain following fall. Initial encounter. EXAM: RIGHT ELBOW - 2 VIEW COMPARISON:  None. FINDINGS: There is no evidence of fracture, dislocation, or joint effusion. There is no evidence of arthropathy or other focal bone abnormality. Soft tissues are unremarkable. IMPRESSION: Negative. Electronically Signed   By: Margarette Canada M.D.   On: 04/05/2018 15:11   Ct Head Wo Contrast  Result Date: 05/01/2018 CLINICAL DATA:  Syncopal episode.  Altered mental status EXAM: CT HEAD WITHOUT CONTRAST TECHNIQUE: Contiguous axial images were obtained from the base of the skull through the vertex without intravenous contrast. COMPARISON:  06/20/2017 FINDINGS: Brain: No acute intracranial abnormality. Specifically, no hemorrhage, hydrocephalus, mass lesion, acute infarction, or significant intracranial injury. Vascular: No hyperdense vessel or unexpected calcification. Skull: No acute calvarial abnormality. Sinuses/Orbits: Visualized paranasal sinuses and mastoids clear. Orbital soft tissues unremarkable. Other: None IMPRESSION: No acute intracranial abnormality. Electronically Signed   By: Rolm Baptise M.D.   On: 05/01/2018 21:57   Ct Angio Chest Pe W And/or Wo Contrast  Result Date: 05/01/2018 CLINICAL DATA:  Weakness and hypoxia. EXAM: CT ANGIOGRAPHY CHEST WITH CONTRAST TECHNIQUE: Multidetector CT imaging of the  chest was performed using the standard protocol during bolus administration of intravenous contrast. Multiplanar CT image reconstructions and MIPs were obtained to evaluate the vascular anatomy. CONTRAST:  50mL OMNIPAQUE IOHEXOL 350 MG/ML SOLN COMPARISON:  None. FINDINGS: Cardiovascular: Good opacification of the central and segmental pulmonary arteries. No focal filling defects. No evidence of significant pulmonary embolus. Normal heart size. No pericardial effusion. Normal caliber thoracic aorta. No aortic dissection. Great vessel origins are patent. Mediastinum/Nodes: The esophagus is diffusely dilated and filled with fluid and ingested material. Dilatation extends to the esophagogastric junction. Differential diagnosis would include achalasia, distal stricture, or distal esophageal carcinoma. Mildly prominent lymph nodes noted at the EG junction may favor tumor. Consider upper endoscopy for further evaluation. Lungs/Pleura: Patchy infiltration and consolidation in the lung bases may represent aspiration or pneumonia. No pleural effusions. No pneumothorax. Airways are patent. Upper Abdomen: No acute abnormalities identified. Musculoskeletal: Severe degenerative changes in both shoulders with bilateral shoulder effusions, greater on the left. Degenerative changes in the spine. Review of the MIP images confirms the above findings. IMPRESSION: 1. No evidence of significant pulmonary embolus. 2. Dilated fluid-filled esophagus extending to the esophagogastric junction. Differential diagnosis would include achalasia, distal stricture, or distal esophageal carcinoma. Mild prominence of lymph nodes in the EG junction may favor tumor. Consider upper endoscopy for further evaluation. 3. Patchy infiltration and consolidation in the lung bases may represent aspiration or pneumonia. 4. Severe degenerative changes in both shoulders with bilateral shoulder effusions, greater on the left. Electronically Signed   By: Lucienne Capers M.D.   On: 05/01/2018 23:48   Dg Chest Portable 1 View  Result Date: 05/01/2018 CLINICAL DATA:  Syncope EXAM: PORTABLE CHEST 1 VIEW COMPARISON:  01/02/2018 FINDINGS: 2110 hours. Low volumes. Cardiopericardial silhouette is at upper limits of normal for size. Bibasilar atelectasis or infiltrate noted. The visualized bony structures of the thorax are intact. Degenerative changes evident in both shoulders. Telemetry leads overlie the chest. IMPRESSION: Low volume film with bibasilar atelectasis or infiltrate. Electronically Signed   By: Misty Stanley M.D.   On: 05/01/2018 21:43   Dg Knee Complete 4 Views Left  Result Date: 04/05/2018 CLINICAL DATA:  Acute LEFT knee pain following  fall. Initial encounter. EXAM: LEFT KNEE - COMPLETE 4+ VIEW COMPARISON:  None. FINDINGS: No evidence of fracture, dislocation, or joint effusion. No evidence of arthropathy or other focal bone abnormality. Soft tissues are unremarkable. IMPRESSION: Negative. Electronically Signed   By: Margarette Canada M.D.   On: 04/05/2018 15:13     Assessment and plan- Patient is a 73 y.o. male with history of GE junction adenocarcinoma T2N1 disease being treated with definitive concurrent chemoradiation at [facility].  He is being currently treated for healthcare associated pneumonia, severe hyponatremia and has pancytopenia secondary to ongoing chemotherapy  Pancytopenia: White count and platelet count are mildly improved today.  Hemoglobin remained stable between 7-8.  Last chemotherapy was weekly carbotaxol given on 04/18/2018.  Immature platelet fraction is low normal indicating hypoproliferative process secondary to chemotherapy.  Hemodynamically patient is better and has not had any fevers in the last 24 hours.  I will hold off on giving him Neupogen at this time.  Transfuse if H&H less than 7/21  Patient's confusion may improve after treatment of his underlying hypernatremia.  Nephrology on board  He is being treated for healthcare  associated pneumonia with IV antibiotics.  Patient will follow-up with [facility] oncology upon discharge.    Visit Diagnosis 1. Altered mental status, unspecified altered mental status type   2. Community acquired pneumonia, unspecified laterality      Dr. Randa Evens, MD, MPH [facility] [phone] 05/03/2018 3:47 PM

## 2018-05-03 NOTE — Progress Notes (Signed)
CRITICAL VALUE ALERT  Critical Value:  Sodium 167 Date & Time Notied:  05/03/18 1620  Provider Notified: Pyreddy  Orders Received/Actions taken: no new orders

## 2018-05-03 NOTE — Progress Notes (Signed)
CRITICAL VALUE STICKER  CRITICAL VALUE: Sodium 165  RECEIVER (on-site recipient of call):Maddalena Linarez RN  DATE & TIME NOTIFIED: 05/03/18  2022  MESSENGER (representative from lab):  MD NOTIFIED: pending  TIME OF NOTIFICATION:  RESPONSE:

## 2018-05-03 NOTE — Care Management Note (Signed)
Case Management Note  Patient Details  Name: Bob [name] MRN: [id number] Date of Birth: 1945-03-22  Subjective/Objective:                 Patient is a resident at [facility]. Currently undergoing chemo for esophageal cancer- last treatment 2/10..  Is usually ambulatory. Experienced  multiple syncopal episodes and mental status altered. Room air sats 86%. Patient is not on chronic oxygen.  Admitted with pneumonia/sepsis  Action/Plan:  Notified CSW  Expected Discharge Date:                  Expected Discharge Plan:     In-House Referral:     Discharge planning Services     Post Acute Care Choice:    Choice offered to:     DME Arranged:    DME Agency:     HH Arranged:    HH Agency:     Status of Service:     If discussed at [employer], dates discussed:    Additional Comments:  Katrina Stack, RN 05/03/2018, 9:51 AM

## 2018-05-03 NOTE — Progress Notes (Addendum)
[place] [facility] [place] NAME: Bob [name]    MR#:  [id number]  DATE OF BIRTH:  August 13, 1945  SUBJECTIVE:  CHIEF COMPLAINT:   Chief Complaint  Patient presents with  . Shortness of Breath  . Altered Mental Status  Seen and evaluated today Has some cough Has difficulty breathing Generalized weakness On oxygen via nasal cannula at 4 L No fever  REVIEW OF SYSTEMS:    ROS  CONSTITUTIONAL: No documented fever. Has fatigue, weakness. No weight gain, no weight loss.  EYES: No blurry or double vision.  ENT: No tinnitus. No postnasal drip. No redness of the oropharynx.  RESPIRATORY: Has cough, no wheeze, no hemoptysis. Has dyspnea.  CARDIOVASCULAR: No chest pain. No orthopnea. No palpitations. No syncope.  GASTROINTESTINAL: No nausea, no vomiting or diarrhea. No abdominal pain. No melena or hematochezia.  GENITOURINARY: No dysuria or hematuria.  ENDOCRINE: No polyuria or nocturia. No heat or cold intolerance.  HEMATOLOGY: No anemia. No bruising. No bleeding.  INTEGUMENTARY: No rashes. No lesions.  MUSCULOSKELETAL: No arthritis. No swelling. No gout.  NEUROLOGIC: No numbness, tingling, or ataxia. No seizure-type activity.  PSYCHIATRIC: No anxiety. No insomnia. No ADD.   DRUG ALLERGIES:  No Known Allergies  VITALS:  Blood pressure 117/64, pulse (!) 117, temperature 100.3 F (37.9 C), temperature source Oral, resp. rate 19, height 5\' 7"  (1.702 m), weight 75.8 kg, SpO2 95 %.  PHYSICAL EXAMINATION:   Physical Exam  GENERAL:  73 y.o.-year-old patient lying in the bed oxygen via nasal cannula at 4 L EYES: Pupils equal, round, reactive to light and accommodation. No scleral icterus. Extraocular muscles intact.  HEENT: Head atraumatic, normocephalic. Oropharynx dry and nasopharynx clear.  NECK:  Supple, no jugular venous distention. No thyroid enlargement, no tenderness.  LUNGS: Decreased breath sounds bilaterally, bilateral Rales heard. No use  of accessory muscles of respiration.  CARDIOVASCULAR: S1, S2 tachycardia noted. No murmurs, rubs, or gallops.  ABDOMEN: Soft, nontender, nondistended. Bowel sounds present. No organomegaly or mass.  EXTREMITIES: No cyanosis, clubbing or edema b/l.    NEUROLOGIC: Cranial nerves II through XII are intact. No focal Motor or sensory deficits b/l.   PSYCHIATRIC: The patient is alert and oriented x 2.  SKIN: No obvious rash, lesion, or ulcer.   LABORATORY PANEL:   CBC Recent Labs  Lab 05/03/18 0547  WBC 0.7*  HGB 7.9*  HCT 28.3*  PLT 71*   ------------------------------------------------------------------------------------------------------------------ Chemistries  Recent Labs  Lab 05/01/18 2051  05/03/18 0547  NA 152*   < > 167*  K 4.4   < > 3.7  CL 115*   < > >130*  CO2 28   < > 27  GLUCOSE 116*   < > 114*  BUN 40*   < > 24*  CREATININE 1.89*   < > 1.02  CALCIUM 9.4   < > 9.5  AST 58*  --   --   ALT 25  --   --   ALKPHOS 55  --   --   BILITOT 0.5  --   --    < > = values in this interval not displayed.   ------------------------------------------------------------------------------------------------------------------  Cardiac Enzymes Recent Labs  Lab 05/01/18 2051  TROPONINI 0.07*   ------------------------------------------------------------------------------------------------------------------  RADIOLOGY:  Ct Head Wo Contrast  Result Date: 05/01/2018 CLINICAL DATA:  Syncopal episode.  Altered mental status EXAM: CT HEAD WITHOUT CONTRAST TECHNIQUE: Contiguous axial images were obtained from the base of the skull through the vertex  without intravenous contrast. COMPARISON:  06/20/2017 FINDINGS: Brain: No acute intracranial abnormality. Specifically, no hemorrhage, hydrocephalus, mass lesion, acute infarction, or significant intracranial injury. Vascular: No hyperdense vessel or unexpected calcification. Skull: No acute calvarial abnormality. Sinuses/Orbits: Visualized  paranasal sinuses and mastoids clear. Orbital soft tissues unremarkable. Other: None IMPRESSION: No acute intracranial abnormality. Electronically Signed   By: Rolm Baptise M.D.   On: 05/01/2018 21:57   Ct Angio Chest Pe W And/or Wo Contrast  Result Date: 05/01/2018 CLINICAL DATA:  Weakness and hypoxia. EXAM: CT ANGIOGRAPHY CHEST WITH CONTRAST TECHNIQUE: Multidetector CT imaging of the chest was performed using the standard protocol during bolus administration of intravenous contrast. Multiplanar CT image reconstructions and MIPs were obtained to evaluate the vascular anatomy. CONTRAST:  58mL OMNIPAQUE IOHEXOL 350 MG/ML SOLN COMPARISON:  None. FINDINGS: Cardiovascular: Good opacification of the central and segmental pulmonary arteries. No focal filling defects. No evidence of significant pulmonary embolus. Normal heart size. No pericardial effusion. Normal caliber thoracic aorta. No aortic dissection. Great vessel origins are patent. Mediastinum/Nodes: The esophagus is diffusely dilated and filled with fluid and ingested material. Dilatation extends to the esophagogastric junction. Differential diagnosis would include achalasia, distal stricture, or distal esophageal carcinoma. Mildly prominent lymph nodes noted at the EG junction may favor tumor. Consider upper endoscopy for further evaluation. Lungs/Pleura: Patchy infiltration and consolidation in the lung bases may represent aspiration or pneumonia. No pleural effusions. No pneumothorax. Airways are patent. Upper Abdomen: No acute abnormalities identified. Musculoskeletal: Severe degenerative changes in both shoulders with bilateral shoulder effusions, greater on the left. Degenerative changes in the spine. Review of the MIP images confirms the above findings. IMPRESSION: 1. No evidence of significant pulmonary embolus. 2. Dilated fluid-filled esophagus extending to the esophagogastric junction. Differential diagnosis would include achalasia, distal  stricture, or distal esophageal carcinoma. Mild prominence of lymph nodes in the EG junction may favor tumor. Consider upper endoscopy for further evaluation. 3. Patchy infiltration and consolidation in the lung bases may represent aspiration or pneumonia. 4. Severe degenerative changes in both shoulders with bilateral shoulder effusions, greater on the left. Electronically Signed   By: Lucienne Capers M.D.   On: 05/01/2018 23:48   Dg Chest Portable 1 View  Result Date: 05/01/2018 CLINICAL DATA:  Syncope EXAM: PORTABLE CHEST 1 VIEW COMPARISON:  01/02/2018 FINDINGS: 2110 hours. Low volumes. Cardiopericardial silhouette is at upper limits of normal for size. Bibasilar atelectasis or infiltrate noted. The visualized bony structures of the thorax are intact. Degenerative changes evident in both shoulders. Telemetry leads overlie the chest. IMPRESSION: Low volume film with bibasilar atelectasis or infiltrate. Electronically Signed   By: Misty Stanley M.D.   On: 05/01/2018 21:43     ASSESSMENT AND PLAN:  73 year old male patient with 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, GERD, cerebral palsy currently under hospitalist service for shortness of breath  -Sepsis secondary to pneumonia improving slowly Broad-spectrum IV antibiotics that is IV vancomycin and cefepime antibiotics Follow-up cultures  IV vancomycin and cefepime antibiotic on board  -Acute hypernatremia Change IV fluids to IV D5W Serial sodium monitoring Nephrology evaluation  -Aspiration pneumonia MRSA pcr negative switch to IV zosyn abx Discontinue IV vancomycin and IV cefepime abx Follow-up WBC count and cultures  -Pancytopenia secondary to recent chemotherapy Oncology evaluation and neutropenic precautions No Neupogen recommended at this time Monitor counts  -Esophageal cancer Oncology follow-up No further chemotherapy recommended Further follow-up with [facility] oncology  -Dysphagia secondary to  esophageal cancer Risk of aspiration present Currently n.p.o. Will  advance to pured diet once patient starts tolerating  -Cerebral palsy Supportive care   All the records are reviewed and case discussed with Care Management/Social Worker. Management plans discussed with the patient, family and they are in agreement.  CODE STATUS: Full code  DVT Prophylaxis: SCDs  TOTAL TIME TAKING CARE OF THIS PATIENT: 36 minutes.   POSSIBLE D/C IN 2 to 3 DAYS, DEPENDING ON CLINICAL CONDITION.  Saundra Shelling M.D on 05/03/2018 at 11:28 AM  Between 7am to 6pm - Pager - [phone]  After 6pm go to [email or web address] - password [email or web address] [facility]  Office  [phone]  CC: Primary care physician; Patient, No Pcp Per  Note: This dictation was prepared with Dragon dictation along with smaller phrase technology. Any transcriptional errors that result from this process are unintentional.

## 2018-05-04 DIAGNOSIS — E43 Unspecified severe protein-calorie malnutrition: Secondary | ICD-10-CM

## 2018-05-04 LAB — SODIUM
SODIUM: 166 mmol/L — AB (ref 135–145)
Sodium: 166 mmol/L (ref 135–145)
Sodium: 163 mmol/L (ref 135–145)
Sodium: 164 mmol/L (ref 135–145)
Sodium: 167 mmol/L (ref 135–145)
Sodium: 163 mmol/L (ref 135–145)

## 2018-05-04 NOTE — Progress Notes (Addendum)
SLP Cancellation Note  Patient Details Name: Bob [name] MRN: [id number] DOB: [birth year]   Cancelled treatment:       Reason Eval/Treat Not Completed: Medical issues which prohibited therapy;Patient not medically ready(chart reviewed; consulted MD). Pt has been exhibiting increased episodes of coughing w/ oral intake - pt does have a dx of adenocarcinoma of GE junction, Esophageal dysmotility. Due to a baseline of oropharyngeal phase dysphagia as well(had been recommended a Dysphagia level 1 w/ Nectar liquids diet at BSE yesterday), recommend f/u w/ a MBSS tomorrow for better assessment of the dysphagia and toleration of oral diet. Recommend frequent oral care for hygiene and stimulation of swallowing. NSG updated.  Of note, also noted a climbing Sodium level at 166.   Orinda Kenner, MS, CCC-SLP Watson,Katherine 05/04/2018, 3:24 PM

## 2018-05-04 NOTE — Progress Notes (Signed)
Critical Na of 166. MD made aware.

## 2018-05-04 NOTE — Progress Notes (Signed)
[facility]  ROUNDING NOTE   Subjective:  Serum sodium has improved down to 163. However patient visually seen aspirating on food.   Objective:  Vital signs in last 24 hours:  Temp:  [97.5 F (36.4 C)-100.9 F (38.3 C)] 97.5 F (36.4 C) (02/26 0400) Pulse Rate:  [73-100] 73 (02/26 0400) Resp:  [18-19] 19 (02/26 0400) BP: (108-117)/(63-84) 108/63 (02/26 0400) SpO2:  [94 %-98 %] 95 % (02/26 0400)  Weight change:  Filed Weights   05/01/18 2047  Weight: 75.8 kg    Intake/Output: I/O last 3 completed shifts: In: 3249.5 [I.V.:3031.6; IV Piggyback:218] Out: 1400 [Urine:1400]   Intake/Output this shift:  No intake/output data recorded.  Physical Exam: General: No acute distress  Head: Normocephalic, atraumatic. Dry oral mucosal membranes  Eyes: Anicteric  Neck: Supple, trachea midline  Lungs:  Bilateral rhonchi/rales, normal effort  Heart: S1S2 no rubs  Abdomen:  Soft, nontender, bowel sounds present  Extremities: No peripheral edema.  Neurologic: Awake, alert, following commands  Skin: No lesions       Basic Metabolic Panel: Recent Labs  Lab 05/01/18 2051 05/02/18 0458 05/03/18 0547  05/03/18 1546 05/03/18 1948 05/04/18 0010 05/04/18 0349 05/04/18 0800  NA 152* 155* 167*   < > 167* 165* 166* 163* 164*  K 4.4 4.5 3.7  --   --   --   --   --   --   CL 115* 121* >130*  --   --   --   --   --   --   CO2 28 25 27   --   --   --   --   --   --   GLUCOSE 116* 88 114*  --   --   --   --   --   --   BUN 40* 39* 24*  --   --   --   --   --   --   CREATININE 1.89* 1.46* 1.02  --   --   --   --   --   --   CALCIUM 9.4 8.5* 9.5  --   --   --   --   --   --    < > = values in this interval not displayed.    Liver Function Tests: Recent Labs  Lab 05/01/18 2051  AST 58*  ALT 25  ALKPHOS 55  BILITOT 0.5  PROT 6.9  ALBUMIN 3.1*   No results for input(s): LIPASE, AMYLASE in the last 168 hours. No results for input(s): AMMONIA in the last 168  hours.  CBC: Recent Labs  Lab 05/01/18 2051 05/02/18 0458 05/03/18 0547  WBC 0.6* 0.6*  0.6* 0.7*  NEUTROABS  --  0.5*  --   HGB 9.7* 8.0*  8.0* 7.9*  HCT 33.3* 28.4*  27.7* 28.3*  MCV 89.3 92.8  90.2 92.2  PLT 83* 61*  69* 71*    Cardiac Enzymes: Recent Labs  Lab 05/01/18 2051  TROPONINI 0.07*    BNP: Invalid input(s): POCBNP  CBG: No results for input(s): GLUCAP in the last 168 hours.  Microbiology: Results for orders placed or performed during the hospital encounter of 05/01/18  Blood culture (routine x 2)     Status: None (Preliminary result)   Collection Time: 05/01/18 10:36 PM  Result Value Ref Range Status   Specimen Description BLOOD LEFT HAND  Final   Special Requests   Final    BOTTLES DRAWN AEROBIC ONLY Blood  Culture results may not be optimal due to an inadequate volume of blood received in culture bottles   Culture   Final    NO GROWTH 3 DAYS Performed at [facility], [address] [place], [place] [address]    Report Status PENDING  Incomplete  Blood culture (routine x 2)     Status: None (Preliminary result)   Collection Time: 05/01/18 10:45 PM  Result Value Ref Range Status   Specimen Description BLOOD RIGHT HAND  Final   Special Requests   Final    BOTTLES DRAWN AEROBIC AND ANAEROBIC Blood Culture adequate volume   Culture   Final    NO GROWTH 3 DAYS Performed at [facility], [address] [place],  [address]    Report Status PENDING  Incomplete  MRSA PCR Screening     Status: None   Collection Time: 05/02/18  5:05 PM  Result Value Ref Range Status   MRSA by PCR NEGATIVE NEGATIVE Final    Comment:        The GeneXpert MRSA Assay (FDA approved for NASAL specimens only), is one component of a comprehensive MRSA colonization surveillance program. It is not intended to diagnose MRSA infection nor to guide or monitor treatment for MRSA infections. Performed at [facility], [address] [place],  [address]     Coagulation Studies: No results for input(s): LABPROT, INR in the last 72 hours.  Urinalysis: Recent Labs    05/01/18 2116  COLORURINE YELLOW*  LABSPEC 1.016  PHURINE 5.0  GLUCOSEU NEGATIVE  HGBUR SMALL*  BILIRUBINUR NEGATIVE  KETONESUR NEGATIVE  PROTEINUR 30*  NITRITE NEGATIVE  LEUKOCYTESUR NEGATIVE      Imaging: No results found.   Medications:   . dextrose Stopped (05/03/18 2132)  . piperacillin-tazobactam (ZOSYN)  IV 3.375 g (05/04/18 0515)   . mouth rinse  15 mL Mouth Rinse q12n4p  . pantoprazole (PROTONIX) IV  40 mg Intravenous Q24H   acetaminophen **OR** acetaminophen, ondansetron **OR** ondansetron (ZOFRAN) IV  Assessment/ Plan:  73 y.o. male with a PMHx of cerebral palsy, lumbar degenerative disc disease, depression, GERD, esophageal cancer, who was admitted to [facility] on 05/01/2018 for evaluation of shortness of breath and altered mental status.    1.  Severe hypernatremia. 2.  Acute renal failure, improving. 3.  Esophogeal cancer.   Plan: Serum sodium remains high at 163.  Recommend increasing D5W back to 150 cc/h.  Patient seen actively aspirating.  Discussed with speech pathology.  Patient will most likely need PEG tube.  Family would like to have this performed at [facility].  For now we will maintain the patient on D5W and follow serum sodium.   LOS: 3 Jariyah Hackley 2/26/202011:22 AM

## 2018-05-04 NOTE — Care Management Important Message (Signed)
Important Message  Patient Details  Name: Bob [name] MRN: [id number] Date of Birth: Feb 12, 1946   Medicare Important Message Given:  Yes    Juliann Pulse A Adetokunbo Mccadden 05/04/2018, 11:22 AM

## 2018-05-04 NOTE — Progress Notes (Signed)
Bob [name] Chandler made this nurse aware of increased coughing during meals, and coughing/spitting up foods. This RN also witnessed this during feeding. Making pt NPO and ordering additional speech eval.

## 2018-05-04 NOTE — Progress Notes (Signed)
PT Cancellation Note  Patient Details Name: Bob [name] MRN: [id number] DOB: 1946/01/08   Cancelled Treatment:    Reason Eval/Treat Not Completed: Medical issues which prohibited therapy: Pt's Na 166 falling outside guidelines for participation with PT services.  Will attempt to see pt at a future date/time as medically appropriate.     Linus Salmons PT, DPT 05/04/18, 1:54 PM

## 2018-05-04 NOTE — Progress Notes (Signed)
Initial Nutrition Assessment  DOCUMENTATION CODES:   Severe malnutrition in context of chronic illness  INTERVENTION:  Provide Hormel Shake (Vital Cuisine) po TID with meals, each supplement provides 520 kcal and 22 grams of protein. Patient prefers vanilla.  Provide Magic cup TID with meals, each supplement provides 290 kcal and 9 grams of protein. Patient prefers vanilla.  NUTRITION DIAGNOSIS:   Severe Malnutrition related to chronic illness(cerebral palsy with mild cognitive disorder, advancing age, adenocarcinoma of GE junction) as evidenced by severe fat depletion, severe muscle depletion.  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   -Pending consult for PMT to discuss [place].  Met with patient and his brother-in-law at bedside. Patient's sister (wife of brother-in-law) called in during assessment so she could help provide history and answer questions. Family reports patient has had a decreasing appetite and intake for a while now but it has become worse with his treatment. They also report difficulty with swallowing. He is taking in small amounts at meals, and occasionally only takes in bites/sips. Family reports they are now considering feeding tube placement as a means to improve nutrition status, hydration status, and sodium level. They wanted to know more about feeding tubes. Discussed that though it would be a good way to immediately impact hydration status, feeding tubes do not come without risk. Discussed mainly that a PEG tube would not decrease  risk of aspiration for patient. Family reports they would like to think about feeding tube and may want to decide after they transfer patient closer to where they live. They report they are leaning more towards focusing on quality of life for patient. They shared they lost a son who was only 76 years old to Benedict. They report it happened very quickly and they wished they had focused more on quality of life with him. Patient and sister's father also passed away from cancer.  Patient's UBW was around 185 lbs. They report weight has been slowly decreasing over time. Per chart patient was close to that weight in 2018-early 2019. Unsure if current weight is accurate as patient appears to weigh less on NFPE.  Medications reviewed and include: pantoprazole, D5W at 75 mL/hr (90 grams dextrose, 306 kcal daily), Zosyn.  Labs reviewed: Last Sodium 166. On 2/25 Chloride >130, BUN 24, Iron 30, TIBC 122, Ferritin 315, Folate 31, Vitamin B12 1041.  NUTRITION - FOCUSED PHYSICAL EXAM:    Most Recent Value  Orbital Region  Severe depletion  Upper Arm Region  Severe depletion  Thoracic and Lumbar Region  Moderate depletion  Buccal Region  Severe depletion  Temple Region  Severe depletion  Clavicle Bone Region  Severe depletion  Clavicle and Acromion Bone Region  Moderate depletion  Scapular Bone Region  Severe depletion  Dorsal Hand  Severe depletion  Patellar Region  Severe depletion  Anterior Thigh Region  Severe depletion  Posterior Calf Region  Moderate depletion  Edema (RD Assessment)  None  Hair  Reviewed  Eyes  Reviewed  Mouth  Unable to assess  Skin  Reviewed  Nails  Reviewed  Diet Order:   Diet Order            DIET - DYS 1 Room service appropriate? Yes with Assist; Fluid consistency: Nectar Thick  Diet effective now             EDUCATION NEEDS:   Education needs have been addressed  Skin:  Skin Assessment: Reviewed RN Assessment(ecchymosis)  Last BM:  05/02/2018  Height:    Ht Readings from Last 1 Encounters:  05/01/18 '5\' 7"'  (1.702 m)   Weight:   Wt Readings from Last 1 Encounters:  05/01/18 75.8 kg   Ideal Body Weight:  67.3 kg  BMI:  Body mass index is 26.16 kg/m.  Estimated Nutritional Needs:   Kcal:  5456-2563 (MSJ x 1.3-1.5)  Protein:  100-115 grams (1.3-1.5 grams/kg)  Fluid:  1.9-2.2 L/day  Willey Blade, MS, RD, LDN Office: [phone] Pager: [phone] After Hours/Weekend Pager: [phone]

## 2018-05-04 NOTE — Progress Notes (Signed)
[place] at [place] NAME: Bob [name]    MR#:  [id number]  DATE OF BIRTH:  December 22, 1945  SUBJECTIVE:  CHIEF COMPLAINT:   Chief Complaint  Patient presents with  . Shortness of Breath  . Altered Mental Status  Seen and evaluated today Has cough whenever he swallows food On oxygen via nasal cannula at 4 L Generalized weakness No fever  REVIEW OF SYSTEMS:    ROS  CONSTITUTIONAL: No documented fever. Has fatigue, weakness. No weight gain, no weight loss.  EYES: No blurry or double vision.  ENT: No tinnitus. No postnasal drip. No redness of the oropharynx.  RESPIRATORY: Has cough, no wheeze, no hemoptysis. Has dyspnea.  CARDIOVASCULAR: No chest pain. No orthopnea. No palpitations. No syncope.  GASTROINTESTINAL: No nausea, no vomiting or diarrhea. No abdominal pain. No melena or hematochezia.  GENITOURINARY: No dysuria or hematuria.  ENDOCRINE: No polyuria or nocturia. No heat or cold intolerance.  HEMATOLOGY: No anemia. No bruising. No bleeding.  INTEGUMENTARY: No rashes. No lesions.  MUSCULOSKELETAL: No arthritis. No swelling. No gout.  NEUROLOGIC: No numbness, tingling, or ataxia. No seizure-type activity.  PSYCHIATRIC: No anxiety. No insomnia. No ADD.   DRUG ALLERGIES:  No Known Allergies  VITALS:  Blood pressure 108/63, pulse 73, temperature (!) 97.5 F (36.4 C), temperature source Oral, resp. rate 19, height 5\' 7"  (1.702 m), weight 75.8 kg, SpO2 95 %.  PHYSICAL EXAMINATION:   Physical Exam  GENERAL:  73 y.o.-year-old patient lying in the bed oxygen via nasal cannula at 4 L EYES: Pupils equal, round, reactive to light and accommodation. No scleral icterus. Extraocular muscles intact.  HEENT: Head atraumatic, normocephalic. Oropharynx dry and nasopharynx clear.  NECK:  Supple, no jugular venous distention. No thyroid enlargement, no tenderness.  LUNGS: Decreased breath sounds bilaterally, bilateral Rales heard. No use of  accessory muscles of respiration.  CARDIOVASCULAR: S1, S2 tachycardia noted. No murmurs, rubs, or gallops.  ABDOMEN: Soft, nontender, nondistended. Bowel sounds present. No organomegaly or mass.  EXTREMITIES: No cyanosis, clubbing or edema b/l.    NEUROLOGIC: Cranial nerves II through XII are intact. No focal Motor or sensory deficits b/l.   PSYCHIATRIC: The patient is alert and oriented x 2.  SKIN: No obvious rash, lesion, or ulcer.   LABORATORY PANEL:   CBC Recent Labs  Lab 05/03/18 0547  WBC 0.7*  HGB 7.9*  HCT 28.3*  PLT 71*   ------------------------------------------------------------------------------------------------------------------ Chemistries  Recent Labs  Lab 05/01/18 2051  05/03/18 0547  05/04/18 0800  NA 152*   < > 167*   < > 164*  K 4.4   < > 3.7  --   --   CL 115*   < > >130*  --   --   CO2 28   < > 27  --   --   GLUCOSE 116*   < > 114*  --   --   BUN 40*   < > 24*  --   --   CREATININE 1.89*   < > 1.02  --   --   CALCIUM 9.4   < > 9.5  --   --   AST 58*  --   --   --   --   ALT 25  --   --   --   --   ALKPHOS 55  --   --   --   --   BILITOT 0.5  --   --   --   --    < > =  values in this interval not displayed.   ------------------------------------------------------------------------------------------------------------------  Cardiac Enzymes Recent Labs  Lab 05/01/18 2051  TROPONINI 0.07*   ------------------------------------------------------------------------------------------------------------------  RADIOLOGY:  No results found.   ASSESSMENT AND PLAN:  73 year old male patient with 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, GERD, cerebral palsy currently under hospitalist service for shortness of breath  -Sepsis secondary to pneumonia improving  Secondary to aspiration On IV Zosyn antibiotic Follow-up cultures   -Acute hypernatremia Continue IV D5W at 150 mL/h Serial sodium monitoring Nephrology evaluation  appreciated  -Aspiration pneumonia IV Zosyn antibiotic On pured diet Discussed with family They want a feeding [facility] at [facility] once they have been placed there  -Pancytopenia secondary to recent chemotherapy Oncology evaluation and neutropenic precautions No Neupogen recommended at this time Monitor counts  -Esophageal cancer Oncology follow-up appreciated No further chemotherapy recommended Further follow-up with [facility] oncology  -Cerebral palsy Supportive care  -High risk of morbidity and mortality considering advanced cancer  -Palliative care consult   All the records are reviewed and case discussed with Care Management/Social Worker. Management plans discussed with the patient, family and they are in agreement.  CODE STATUS: Full code  DVT Prophylaxis: SCDs  TOTAL TIME TAKING CARE OF THIS PATIENT: 36 minutes.   POSSIBLE D/C IN 2 to 3 DAYS, DEPENDING ON CLINICAL CONDITION.  Saundra Shelling M.D on 05/04/2018 at 10:46 AM  Between 7am to 6pm - Pager - [phone]  After 6pm go to [email or web address] - password [email or web address] [facility]  Office  [phone]  CC: Primary care physician; Patient, No Pcp Per  Note: This dictation was prepared with Dragon dictation along with smaller phrase technology. Any transcriptional errors that result from this process are unintentional.

## 2018-05-04 NOTE — Progress Notes (Signed)
No charge note:   Palliative note:   Thank you for this consult.  [place] meeting arranged for tomorrow at [place] with patient's HCPOA/Next of Bruno and Verne Carrow.  Mariana Kaufman, AGNP-C Palliative Medicine  Please call Palliative Medicine team phone with any questions [phone]. For individual providers please see AMION.

## 2018-05-05 ENCOUNTER — Inpatient Hospital Stay: Payer: Medicare Other

## 2018-05-05 DIAGNOSIS — K210 Gastro-esophageal reflux disease with esophagitis: Secondary | ICD-10-CM

## 2018-05-05 DIAGNOSIS — C155 Malignant neoplasm of lower third of esophagus: Secondary | ICD-10-CM

## 2018-05-05 DIAGNOSIS — Z515 Encounter for palliative care: Secondary | ICD-10-CM

## 2018-05-05 DIAGNOSIS — Z7189 Other specified counseling: Secondary | ICD-10-CM

## 2018-05-05 DIAGNOSIS — R1319 Other dysphagia: Secondary | ICD-10-CM

## 2018-05-05 DIAGNOSIS — R4182 Altered mental status, unspecified: Secondary | ICD-10-CM

## 2018-05-05 DIAGNOSIS — R1310 Dysphagia, unspecified: Secondary | ICD-10-CM

## 2018-05-05 LAB — CBC
HCT: 31.4 % — ABNORMAL LOW (ref 39.0–52.0)
HEMOGLOBIN: 8.7 g/dL — AB (ref 13.0–17.0)
MCH: 25.7 pg — ABNORMAL LOW (ref 26.0–34.0)
MCHC: 27.7 g/dL — ABNORMAL LOW (ref 30.0–36.0)
MCV: 92.9 fL (ref 80.0–100.0)
Platelets: 78 10*3/uL — ABNORMAL LOW (ref 150–400)
RBC: 3.38 MIL/uL — ABNORMAL LOW (ref 4.22–5.81)
RDW: 19.8 % — ABNORMAL HIGH (ref 11.5–15.5)
WBC: 1.1 10*3/uL — CL (ref 4.0–10.5)
nRBC: 0 % (ref 0.0–0.2)

## 2018-05-05 LAB — SODIUM
SODIUM: 168 mmol/L — AB (ref 135–145)
Sodium: 164 mmol/L (ref 135–145)
Sodium: 164 mmol/L (ref 135–145)
Sodium: 163 mmol/L (ref 135–145)
Sodium: 161 mmol/L (ref 135–145)
Sodium: 155 mmol/L — ABNORMAL HIGH (ref 135–145)

## 2018-05-05 MED ORDER — ACETAMINOPHEN 10 MG/ML IV SOLN
1000.0000 mg | Freq: Four times a day (QID) | INTRAVENOUS | Status: AC
  Administered 2018-05-05 – 2018-05-06 (×4): 1000 mg via INTRAVENOUS
  Filled 2018-05-05 (×4): qty 100

## 2018-05-05 MED ORDER — MAGIC MOUTHWASH
5.0000 mL | Freq: Three times a day (TID) | ORAL | Status: DC
  Administered 2018-05-06 – 2018-05-09 (×7): 5 mL via ORAL
  Filled 2018-05-05 (×5): qty 10

## 2018-05-05 MED ORDER — DEXTROSE 5 % IV BOLUS
600.0000 mL | Freq: Once | INTRAVENOUS | Status: AC
  Administered 2018-05-05: 600 mL via INTRAVENOUS

## 2018-05-05 MED ORDER — MIDODRINE HCL 5 MG PO TABS: 5.0000 mg | ORAL_TABLET | Freq: Three times a day (TID) | ORAL | Status: DC

## 2018-05-05 MED ORDER — SODIUM CHLORIDE 0.9 % IV SOLN
INTRAVENOUS | Status: DC | PRN
  Administered 2018-05-05 – 2018-05-07 (×7): 250 mL via INTRAVENOUS
  Administered 2018-05-08: 21:00:00 5 mL via INTRAVENOUS
  Administered 2018-05-08 (×2): 250 mL via INTRAVENOUS
  Administered 2018-05-09: 05:00:00 5 mL via INTRAVENOUS

## 2018-05-05 NOTE — Progress Notes (Signed)
[facility]  ROUNDING NOTE   Subjective:  Serum sodium remains high at 164. d5W increased to 200 cc per hour earlier this a.m.   Objective:  Vital signs in last 24 hours:  Temp:  [97.9 F (36.6 C)-98.8 F (37.1 C)] 97.9 F (36.6 C) (02/27 0452) Pulse Rate:  [76-109] 76 (02/27 0452) Resp:  [18-20] 19 (02/27 0452) BP: (104-109)/(57-86) 109/57 (02/27 0452) SpO2:  [93 %] 93 % (02/27 0452)  Weight change:  Filed Weights   05/01/18 2047  Weight: 75.8 kg    Intake/Output: I/O last 3 completed shifts: In: 1886.9 [I.V.:1686.9; IV Piggyback:200] Out: 1300 [Urine:1300]   Intake/Output this shift:  Total I/O In: 892.9 [I.V.:842.9; IV Piggyback:50] Out: -   Physical Exam: General: No acute distress  Head: Normocephalic, atraumatic. Dry oral mucosal membranes  Eyes: Anicteric  Neck: Supple, trachea midline  Lungs:  Bilateral rhonchi/rales, normal effort  Heart: S1S2 no rubs  Abdomen:  Soft, nontender, bowel sounds present  Extremities: No peripheral edema.  Neurologic: Awake, alert, following commands  Skin: No lesions       Basic Metabolic Panel: Recent Labs  Lab 05/01/18 2051 05/02/18 0458 05/03/18 0547  05/04/18 1744 05/04/18 2108 05/05/18 0126 05/05/18 0549 05/05/18 0933  NA 152* 155* 167*   < > 167* 163* 168* 164* 164*  K 4.4 4.5 3.7  --   --   --   --   --   --   CL 115* 121* >130*  --   --   --   --   --   --   CO2 28 25 27   --   --   --   --   --   --   GLUCOSE 116* 88 114*  --   --   --   --   --   --   BUN 40* 39* 24*  --   --   --   --   --   --   CREATININE 1.89* 1.46* 1.02  --   --   --   --   --   --   CALCIUM 9.4 8.5* 9.5  --   --   --   --   --   --    < > = values in this interval not displayed.    Liver Function Tests: Recent Labs  Lab 05/01/18 2051  AST 58*  ALT 25  ALKPHOS 55  BILITOT 0.5  PROT 6.9  ALBUMIN 3.1*   No results for input(s): LIPASE, AMYLASE in the last 168 hours. No results for input(s): AMMONIA in  the last 168 hours.  CBC: Recent Labs  Lab 05/01/18 2051 05/02/18 0458 05/03/18 0547 05/05/18 0126  WBC 0.6* 0.6*  0.6* 0.7* 1.1*  NEUTROABS  --  0.5*  --   --   HGB 9.7* 8.0*  8.0* 7.9* 8.7*  HCT 33.3* 28.4*  27.7* 28.3* 31.4*  MCV 89.3 92.8  90.2 92.2 92.9  PLT 83* 61*  69* 71* 78*    Cardiac Enzymes: Recent Labs  Lab 05/01/18 2051  TROPONINI 0.07*    BNP: Invalid input(s): POCBNP  CBG: No results for input(s): GLUCAP in the last 168 hours.  Microbiology: Results for orders placed or performed during the hospital encounter of 05/01/18  Blood culture (routine x 2)     Status: None (Preliminary result)   Collection Time: 05/01/18 10:36 PM  Result Value Ref Range Status   Specimen Description BLOOD LEFT HAND  Final   Special Requests   Final    BOTTLES DRAWN AEROBIC ONLY Blood Culture results may not be optimal due to an inadequate volume of blood received in culture bottles   Culture   Final    NO GROWTH 4 DAYS Performed at [facility], [address], [place], [place] [address]    Report Status PENDING  Incomplete  Blood culture (routine x 2)     Status: None (Preliminary result)   Collection Time: 05/01/18 10:45 PM  Result Value Ref Range Status   Specimen Description BLOOD RIGHT HAND  Final   Special Requests   Final    BOTTLES DRAWN AEROBIC AND ANAEROBIC Blood Culture adequate volume   Culture   Final    NO GROWTH 4 DAYS Performed at [facility], [address], [place], [place] [address]    Report Status PENDING  Incomplete  MRSA PCR Screening     Status: None   Collection Time: 05/02/18  5:05 PM  Result Value Ref Range Status   MRSA by PCR NEGATIVE NEGATIVE Final    Comment:        The GeneXpert MRSA Assay (FDA approved for NASAL specimens only), is one component of a comprehensive MRSA colonization surveillance program. It is not intended to diagnose MRSA infection nor to guide or monitor treatment for MRSA  infections. Performed at [facility], [address] [place], [place] [address]     Coagulation Studies: No results for input(s): LABPROT, INR in the last 72 hours.  Urinalysis: No results for input(s): COLORURINE, LABSPEC, PHURINE, GLUCOSEU, HGBUR, BILIRUBINUR, KETONESUR, PROTEINUR, UROBILINOGEN, NITRITE, LEUKOCYTESUR in the last 72 hours.  Invalid input(s): APPERANCEUR    Imaging: No results found.   Medications:   . acetaminophen    . dextrose 200 mL/hr at 05/05/18 1353  . piperacillin-tazobactam (ZOSYN)  IV Stopped (05/05/18 1002)   . mouth rinse  15 mL Mouth Rinse q12n4p  . pantoprazole (PROTONIX) IV  40 mg Intravenous Q24H   ondansetron **OR** ondansetron (ZOFRAN) IV  Assessment/ Plan:  73 y.o. male with a PMHx of cerebral palsy, lumbar degenerative disc disease, depression, GERD, esophageal cancer, who was admitted to [facility] on 05/01/2018 for evaluation of shortness of breath and altered mental status.    1.  Severe hypernatremia. 2.  Acute renal failure, improving. 3.  Esophogeal cancer.  4.  Aspiration pneumonia.   Plan: it appears that the rate of D5W was decreased overnight.  Reason for this currently unclear.  We have increased D5W to 200 cc per hour.  Confirmed this dose with nursing.  Family requested transfer to [facility].  Continue to monitor serum sodium frequently for now.   LOS: 4 Tarynn Garling 2/27/20202:12 PM

## 2018-05-05 NOTE — Progress Notes (Signed)
   05/05/18 1100  Clinical Encounter Type  Visited With Patient  Visit Type Initial  Pt has difficulties with speech. Ch was rounding.

## 2018-05-05 NOTE — Clinical Social Work Note (Signed)
Patient's family would now like patient transferred to hospital in [facility]. MD is aware and working on transfer. CSW signing off. Please re consult if further needs arise.   [place], [place]

## 2018-05-05 NOTE — Consult Note (Addendum)
Consultation Note Date: 05/05/2018   Patient Name: Bob [name]  DOB: September 21, 1945  MRN: [id number]  Age / Sex: 73 y.o., male  PCP: Patient, No Pcp Per Referring Physician: Saundra Shelling, MD  Reason for Consultation: Establishing goals of care  HPI/Patient Profile: 73 y.o. 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 Workup reveals pneumonia, ongoing aspiration, severe hypernatremia. Palliative medicine consulted for [name].    Clinical Assessment and Goals of Care:  I have reviewed medical records including EPIC notes, labs and imaging,  assessed the patient and then met at the bedside along with patient's brother in law (in person) and sister (via phone)  to discuss diagnosis prognosis, [place], EOL wishes, disposition and options.  I introduced Palliative Medicine as specialized medical care for people living with serious illness. It focuses on providing relief from the symptoms and stress of a serious illness. The goal is to improve quality of life for both the patient and the family.  We discussed a brief life review of the patient. He has been independent most of his life- worked in Academic librarian for the state- was married. His spouse died several years ago. He has experienced great depression and required ECT treatments. His sister is his only living family member.   As far as functional and nutritional status- prior to this admission- he required a walker for ambulation and would need frequent rests and often required pushing. He required assistance for bathing and dressing. He was able to feed himself- but his family has noticed a great decrease in his eating for quite some time now and he has lost a great amount of weight over the last  year. They note that his decline has increased since starting chemotherapy- which they expected- but they were hoping the chemotherapy would eventually improve his ability to take in food and enable him to regain his strength.   We discussed his current illness and what it means in the larger context of her on-going co-morbidities.  Natural disease trajectory and expectations at EOL were discussed. Bob [name] (patient's sister) was very focused on getting patient transferred to a hospital in [place]. She works in [place] there. She feels she does not have clear mind to discuss overall GOC and EOL information until this is settled.   The difference between aggressive medical intervention.  Advanced directives, concepts specific to code status, artifical feeding were discussed. For now- they do not want to consider a feeding tube. Bob [name] and Bob [name] are aware of patient's overall decline and likely trajectory towards EOL. They do request DNR status for patient.   Questions and concerns were addressed. The family was encouraged to call with questions or concerns.    Primary Decision Maker NEXT OF KIN- Bob [name], Bob [name]    SUMMARY OF RECOMMENDATIONS -DNR -Due to npo status and low platelets- recommend IV tylenol 1076m q6hr for arthritis  and other pain management -Pt complaining of sore, dry mouth, will start Magic moutwash rinses -Family requests continue to treat what is treatable -They would like to pursue inpatient transfer to [facility] in [place], [place] -[place] pending    Code Status/Advance Care Planning:  DNR  Palliative Prophylaxis:   Oral Care  Additional Recommendations (Limitations, Scope, Preferences):  Minimize Medications and Full Scope Treatment  Prognosis:    Unable to determine  Discharge Planning: To Be Determined  Primary Diagnoses: Present on Admission: . Severe sepsis ([place]) . HCAP (healthcare-associated pneumonia) . AKI  (acute kidney injury) ([place]) . GERD (gastroesophageal reflux disease) . Cerebral palsy ([place]) . 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 The following aspects are pertinent.  Past Medical History:  Diagnosis Date  . Back pain   . Cancer ([place])   . Cerebral palsy ([place])   . DDD (degenerative disc disease), lumbar   . Depression   . Depression   . GERD (gastroesophageal reflux disease)   . Mild cognitive disorder   . Other intervertebral disc degeneration, lumbar region    Social History   Socioeconomic History  . Marital status: Widowed    Spouse name: Not on file  . Number of children: Not on file  . Years of education: Not on file  . Highest education level: Not on file  Occupational History  . Not on file  Social Needs  . Financial resource strain: Not on file  . Food insecurity:    Worry: Not on file    Inability: Not on file  . Transportation needs:    Medical: Not on file    Non-medical: Not on file  Tobacco Use  . Smoking status: Former Research scientist (life sciences)  . Smokeless tobacco: Never Used  Substance and Sexual Activity  . Alcohol use: No  . Drug use: No  . Sexual activity: Not on file  Lifestyle  . Physical activity:    Days per week: Not on file    Minutes per session: Not on file  . Stress: Not on file  Relationships  . Social connections:    Talks on phone: Not on file    Gets together: Not on file    Attends religious service: Not on file    Active member of club or organization: Not on file    Attends meetings of clubs or organizations: Not on file    Relationship status: Not on file  Other Topics Concern  . Not on file  Social History Narrative  . Not on file   History reviewed. No pertinent family history. Scheduled Meds: . mouth rinse  15 mL Mouth Rinse q12n4p  . pantoprazole (PROTONIX) IV  40 mg Intravenous Q24H   Continuous Infusions: . acetaminophen    . dextrose  200 mL/hr at 05/05/18 1140  . piperacillin-tazobactam (ZOSYN)  IV Stopped (05/05/18 1002)   PRN Meds:.ondansetron **OR** ondansetron (ZOFRAN) IV Medications Prior to Admission:  Prior to Admission medications   Medication Sig Start Date End Date Taking? Authorizing Provider  acetaminophen (TYLENOL) 500 MG tablet Take 500 mg by mouth 2 (two) times daily.   Yes [provider]  ARIPiprazole (ABILIFY) 2 MG tablet Take 2 mg by mouth every Monday, Wednesday, and Friday. At bedtime.   Yes [provider]  aspirin EC 81 MG tablet Take 81 mg by mouth daily.   Yes [provider]  bisacodyl (DULCOLAX) 5 MG EC  tablet Take 5 mg by mouth at bedtime.   Yes [provider]  Ca Carbonate-Mag Hydroxide (ROLAIDS PO) Take 1 tablet by mouth every 8 (eight) hours as needed. Chew and swallow 1 tablet by mouth every 8 hours as needed for acid reflux.   Yes [provider]  Ca Carbonate-Mag Hydroxide (ROLAIDS) 550-110 MG CHEW Chew 2 tablets by mouth 3 (three) times daily. Chew and swallow 2 tablets before meals.   Yes [provider]  Carboxymethylcellulose Sodium (REFRESH LIQUIGEL) 1 % GEL Place 2 drops into the right eye 3 (three) times daily as needed.   Yes [provider]  cetirizine (ZYRTEC) 10 MG tablet Take 10 mg by mouth daily.   Yes [provider]  Cholecalciferol (D3-1000 PO) Take 1,000 Units by mouth daily.   Yes [provider]  docusate sodium (COLACE) 100 MG capsule Take 200 mg by mouth daily. At 6 AM   Yes [provider]  DULoxetine (CYMBALTA) 60 MG capsule Take 60 mg by mouth daily. At 6 AM   Yes [provider]  fluticasone (FLONASE) 50 MCG/ACT nasal spray Place 1 spray into both nostrils daily.   Yes [provider]  gabapentin (NEURONTIN) 600 MG tablet Take 600 mg by mouth at bedtime.   Yes [provider]  ibuprofen (ADVIL,MOTRIN) 600 MG tablet Take 600 mg by mouth every 6 (six)  hours as needed for pain. Or inflammation. 06/08/17  Yes [provider]  levothyroxine (SYNTHROID, LEVOTHROID) 100 MCG tablet Take 100 mcg by mouth daily before breakfast.   Yes [provider]  Lidocaine 4 % PTCH Apply 1 patch topically daily. Apply to right shoulder at 8 AM, leave on for 12 hours, then remove at 8 PM and leave off for 12 hours.   Yes [provider]  loperamide (IMODIUM A-D) 2 MG tablet Take 2 mg by mouth every 4 (four) hours as needed for diarrhea or loose stools. Do not use for more than 48 hours consecutively. 06/08/17  Yes [provider]  magic mouthwash w/lidocaine SOLN Take 15-30 mLs by mouth 3 (three) times daily. Take 15 - 30 ML by mouth 5-10 minutes before a meal. Use a straw and try to swallow directly without swishing around in mouth.   Yes [provider]  Melatonin 3 MG TABS Take 3 mg by mouth at bedtime. At 8 PM   Yes [provider]  memantine (NAMENDA XR) 28 MG CP24 24 hr capsule Take 28 mg by mouth daily.   Yes [provider]  Menthol (ICY HOT BACK) 5 % PTCH Apply 1 patch topically daily as needed. Remove at bedtime   Yes [provider]  modafinil (PROVIGIL) 200 MG tablet Take 200 mg by mouth daily.    Yes [provider]  morphine (MS CONTIN) 15 MG 12 hr tablet Take 1 tablet (15 mg total) by mouth every 12 (twelve) hours. 11/24/16  Yes Earleen Newport, MD  Multiple Vitamins-Minerals (CERTAVITE SENIOR/ANTIOXIDANT PO) Take 1 tablet by mouth daily. At 6 AM   Yes [provider]  oxycodone (OXY-IR) 5 MG capsule Take 5 mg by mouth every 6 (six) hours. At 08:00, 14:00, and 20:00 for 23 days. 04/19/18 05/11/18 Yes [provider]  pantoprazole (PROTONIX) 40 MG tablet Take 1 tablet (40 mg total) by mouth daily. Patient taking differently: Take 40 mg by mouth 2 (two) times daily.  07/01/17  Yes [name], MD  Polyethyl Glycol-Propyl Glycol (SYSTANE) 0.4-0.3 %  GEL ophthalmic  gel Place 1 drop into both eyes at bedtime.   Yes [provider]  polyethylene glycol (MIRALAX / GLYCOLAX) packet Take 17 g by mouth every other day.    Yes [provider]  polyethylene glycol (MIRALAX / GLYCOLAX) packet Take 17 g by mouth every other day as needed (if no bowel movement in 48 hours). Mix 1 capful (17 grams) with 8 oz of fluid every other day as needed.   Yes [provider]  prochlorperazine (COMPAZINE) 10 MG tablet Take 10 mg by mouth every 6 (six) hours as needed for nausea. 03/29/18  Yes [provider]  Propylene Glycol (SYSTANE COMPLETE) 0.6 % SOLN Place 1 drop into both eyes 3 (three) times daily.   Yes [provider]  senna (SENOKOT) 8.6 MG TABS tablet Take 1 tablet by mouth 2 (two) times daily.   Yes [provider]  simvastatin (ZOCOR) 20 MG tablet Take 20 mg by mouth at bedtime.   Yes [provider]  tamsulosin (FLOMAX) 0.4 MG CAPS capsule Take 0.4 mg by mouth daily after supper.   Yes [provider]  traMADol (ULTRAM) 50 MG tablet Take 50 mg by mouth every 8 (eight) hours as needed for pain.   Yes [provider]  trolamine salicylate (ASPERCREME) 10 % cream Apply 1 application topically 2 (two) times daily. To hands and right shoulder. Do not apply under [facility] patch.   Yes [provider]  ondansetron (ZOFRAN-ODT) 8 MG disintegrating tablet Take 8 mg by mouth every 8 (eight) hours as needed for nausea.    [provider]   No Known Allergies Review of Systems  Unable to perform ROS: Mental status change    Physical Exam Vitals signs and nursing note reviewed.  Constitutional:      Comments: cachetic  Cardiovascular:     Rate and Rhythm: Normal rate and regular rhythm.  Pulmonary:     Breath sounds: Rales present.  Abdominal:     General: Abdomen is flat.  Musculoskeletal:     Comments: Hands contracted  Skin:    General: Skin is warm and dry.    Neurological:     Mental Status: He is alert.     Comments: Oriented to person only     Vital Signs: BP (!) 109/57 (BP Location: Right Arm)   Pulse 76   Temp 97.9 F (36.6 C) (Oral)   Resp 19   Ht '5\' 7"'  (1.702 m)   Wt 75.8 kg   SpO2 93%   BMI 26.16 kg/m  Pain Scale: 0-10   Pain Score: 0-No pain   SpO2: SpO2: 93 % O2 Device:SpO2: 93 % O2 Flow Rate: .O2 Flow Rate (L/min): 4 L/min  IO: Intake/output summary:   Intake/Output Summary (Last 24 hours) at 05/05/2018 1209 Last data filed at 05/05/2018 1140 Gross per 24 hour  Intake 1651.25 ml  Output 900 ml  Net 751.25 ml    LBM: Last BM Date: 05/02/18 Baseline Weight: Weight: 75.8 kg Most recent weight: Weight: 75.8 kg     Palliative Assessment/Data: PPS: 20%   Flowsheet Rows     Most Recent Value  Intake Tab  Referral Department  Hospitalist  Unit at Time of Referral  Med/Surg Unit  Date Notified  05/04/18  Palliative Care Type  New Palliative care  Reason for referral  Clarify Goals of Care  Date of Admission  05/01/18  # of days IP prior to Palliative referral  3  Clinical Assessment  Psychosocial & Spiritual Assessment  Palliative Care Outcomes      Thank you for this consult. Palliative medicine will continue to follow and assist as needed.   Time In: 1000 Time Out: 1200 Time Total: 120 mins Prolonged services billed: yes Greater than 50%  of this time was spent counseling and coordinating care related to the above assessment and plan.  Signed by: Mariana Kaufman, AGNP-C Palliative Medicine    Please contact Palliative Medicine Team phone at [phone] for questions and concerns.  For individual provider: See Shea Evans

## 2018-05-05 NOTE — Progress Notes (Addendum)
[place] [facility] [place] NAME: Bob [name]    MR#:  [id number]  DATE OF BIRTH:  Aug 05, 1945  SUBJECTIVE:  CHIEF COMPLAINT:   Chief Complaint  Patient presents with  . Shortness of Breath  . Altered Mental Status  Seen and evaluated today Has cough whenever he swallows food Has been kept npo Has decreased cough and looks better On oxygen via nasal cannula at 4 L No fever  REVIEW OF SYSTEMS:    ROS  CONSTITUTIONAL: No documented fever. Has fatigue, weakness. No weight gain, no weight loss.  EYES: No blurry or double vision.  ENT: No tinnitus. No postnasal drip. No redness of the oropharynx.  RESPIRATORY: Has cough, no wheeze, no hemoptysis. Has dyspnea.  CARDIOVASCULAR: No chest pain. No orthopnea. No palpitations. No syncope.  GASTROINTESTINAL: No nausea, no vomiting or diarrhea. No abdominal pain. No melena or hematochezia.  GENITOURINARY: No dysuria or hematuria.  ENDOCRINE: No polyuria or nocturia. No heat or cold intolerance.  HEMATOLOGY: No anemia. No bruising. No bleeding.  INTEGUMENTARY: No rashes. No lesions.  MUSCULOSKELETAL: No arthritis. No swelling. No gout.  NEUROLOGIC: No numbness, tingling, or ataxia. No seizure-type activity.  PSYCHIATRIC: No anxiety. No insomnia. No ADD.   DRUG ALLERGIES:  No Known Allergies  VITALS:  Blood pressure (!) 109/57, pulse 76, temperature 97.9 F (36.6 C), temperature source Oral, resp. rate 19, height 5\' 7"  (1.702 m), weight 75.8 kg, SpO2 93 %.  PHYSICAL EXAMINATION:   Physical Exam  GENERAL:  73 y.o.-year-old patient lying in the bed oxygen via nasal cannula at 4 L EYES: Pupils equal, round, reactive to light and accommodation. No scleral icterus. Extraocular muscles intact.  HEENT: Head atraumatic, normocephalic. Oropharynx dry and nasopharynx clear.  NECK:  Supple, no jugular venous distention. No thyroid enlargement, no tenderness.  LUNGS: Improved breath sounds bilaterally,  bilateral decreased rales heard. No use of accessory muscles of respiration.  CARDIOVASCULAR: S1, S2 normal. No murmurs, rubs, or gallops.  ABDOMEN: Soft, nontender, nondistended. Bowel sounds present. No organomegaly or mass.  EXTREMITIES: No cyanosis, clubbing or edema b/l.    NEUROLOGIC: Cranial nerves II through XII are intact. No focal Motor or sensory deficits b/l.   PSYCHIATRIC: The patient is alert and oriented x 2.  SKIN: No obvious rash, lesion, or ulcer.   LABORATORY PANEL:   CBC Recent Labs  Lab 05/05/18 0126  WBC 1.1*  HGB 8.7*  HCT 31.4*  PLT 78*   ------------------------------------------------------------------------------------------------------------------ Chemistries  Recent Labs  Lab 05/01/18 2051  05/03/18 0547  05/05/18 0549  NA 152*   < > 167*   < > 164*  K 4.4   < > 3.7  --   --   CL 115*   < > >130*  --   --   CO2 28   < > 27  --   --   GLUCOSE 116*   < > 114*  --   --   BUN 40*   < > 24*  --   --   CREATININE 1.89*   < > 1.02  --   --   CALCIUM 9.4   < > 9.5  --   --   AST 58*  --   --   --   --   ALT 25  --   --   --   --   ALKPHOS 55  --   --   --   --   BILITOT  0.5  --   --   --   --    < > = values in this interval not displayed.   ------------------------------------------------------------------------------------------------------------------  Cardiac Enzymes Recent Labs  Lab 05/01/18 2051  TROPONINI 0.07*   ------------------------------------------------------------------------------------------------------------------  RADIOLOGY:  No results found.   ASSESSMENT AND PLAN:  73 year old male patient with 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, GERD, cerebral palsy currently under hospitalist service for shortness of breath  -Sepsis secondary to pneumonia improving  Secondary to aspiration On IV Zosyn antibiotic Follow-up cultures  NPO for now to avoid more aspiration episodes  -Acute hypernatremia  resolving slowly  IV D5W at 150 mL/h Serial sodium monitoring Nephrology evaluation and follow up appreciated  -Aspiration pneumonia IV Zosyn antibiotic Pureed diet on hold Discussed with family  -Pancytopenia secondary to recent chemotherapy Oncology evaluation and neutropenic precautions No Neupogen recommended at this time by oncology Monitor counts  -Esophageal cancer Oncology follow-up appreciated No further chemotherapy recommended Further follow-up with [facility] oncology  -Cerebral palsy Supportive care  -High risk of morbidity and mortality considering advanced cancer  -Palliative care consult  Team meeting family today Discussed with patient's family They want to transfer to [facility] transfer center, discussed patient's case.  Patient not accepted.  Family has been updated  All the records are reviewed and case discussed with Care Management/Social Worker. Management plans discussed with the patient, family and they are in agreement.  CODE STATUS: Full code  DVT Prophylaxis: SCDs  TOTAL TIME TAKING CARE OF THIS PATIENT: 20minutes.   POSSIBLE D/C IN 2 to 3 DAYS, DEPENDING ON CLINICAL CONDITION.  Saundra Shelling M.D on 05/05/2018 at 10:30 AM  Between 7am to 6pm - Pager - [phone]  After 6pm go to [email or web address] - password [email or web address] [place] [facility]  Office  [phone]  CC: Primary care physician; Patient, No Pcp Per  Note: This dictation was prepared with Dragon dictation along with smaller phrase technology. Any transcriptional errors that result from this process are unintentional.

## 2018-05-05 NOTE — Progress Notes (Addendum)
Additional non face to face time: 60 mins Time in: 1505 Time Out: 1605  Received report of MBS test from Orinda Kenner, SLP (no apparent opening of upper ES). Also noted that [facility] had declined to take patient.  I called patient's sister to update her with above information.  Gave emotional support as she expressed her frustrations. Further discussed overall GOC for patient and worries for continued decline despite any interventions.  We discussed what further aggressive medical interventions would look like- including recommendation of ENT consultation per SLP, continued hospitalization with attempts at correction of electrolytes, PEG tube, etc. Vs transition to full comfort measures, comfort feeding with assistance of Hospice. Discussed that if transition to full comfort care is made patient likely appropriate for residential Hospice.   Bob [name] is considering above information. Asked appropriate questions. She requested that any healthcare information be provided only to her, her husband Bob [name], or patient's cousin- Bob [name].   Bob [name], AGNP-C Palliative Medicine  Greater than 50%  of this time was spent counseling and coordinating care of this patient.  Please call Palliative Medicine team phone with any questions [phone]. For individual providers please see AMION.

## 2018-05-05 NOTE — NC FL2 (Addendum)
[name] LEVEL OF CARE SCREENING TOOL     IDENTIFICATION  Patient Name: Bob [name] Birthdate: [birth year] Sex: male Admission Date (Current Location): 05/01/2018  [place] and [place] Number:  Engineering geologist and Address:  [facility], [address] [place], [place] [address]      Provider Number: [id number]  Attending Physician Name and Address:  Saundra Shelling, MD  Relative Name and Phone Number:       Current Level of Care: Hospital Recommended Level of Care: Assisted living  Prior Approval Number:    Date Approved/Denied:   PASRR Number: [id number]  Discharge Plan: Domiciliary (ALF)     Current Diagnoses: Patient Active Problem List   Diagnosis Date Noted  . Protein-calorie malnutrition, severe 05/04/2018  . Severe sepsis ([place]) 05/01/2018  . HCAP (healthcare-associated pneumonia) 05/01/2018  . AKI (acute kidney injury) ([place]) 05/01/2018  . GERD (gastroesophageal reflux disease) 05/01/2018  . Cerebral palsy ([place]) 05/01/2018  . 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 GI bleed 06/30/2017    Orientation RESPIRATION BLADDER Height & Weight     Self, Place  O2(4 liters ) Incontinent Weight: 167 lb (75.8 kg) Height:  5\' 7"  (170.2 cm)  BEHAVIORAL SYMPTOMS/MOOD NEUROLOGICAL BOWEL NUTRITION STATUS  (none) (None ) Incontinent Diet(NPO to be advanced )  AMBULATORY STATUS COMMUNICATION OF NEEDS Skin   Extensive Assist Verbally(Has slurred speech ) Normal                       Personal Care Assistance Level of Assistance  Bathing, Feeding, Dressing Bathing Assistance: Limited assistance Feeding assistance: Limited assistance Dressing Assistance: Maximum assistance     Functional Limitations Info  Sight, Hearing, Speech Sight Info: Adequate Hearing Info: Adequate Speech Info: Adequate    SPECIAL CARE FACTORS FREQUENCY  PT (By licensed PT), OT (By licensed OT), Speech therapy     PT Frequency: 5 OT Frequency:  5     Speech Therapy Frequency: 3      Contractures Contractures Info: Not present    Additional Factors Info  Code Status, Allergies Code Status Info: Full Code  Allergies Info: NKA           Current Medications (05/05/2018):  This is the current hospital active medication list Current Facility-Administered Medications  Medication Dose Route Frequency Provider Last Rate Last Dose  . acetaminophen (TYLENOL) tablet 650 mg  650 mg Oral Q6H PRN Lance Coon, MD   650 mg at 05/03/18 2019   Or  . acetaminophen (TYLENOL) suppository 650 mg  650 mg Rectal Q6H PRN Lance Coon, MD   650 mg at 05/02/18 0452  . dextrose 5 % solution   Intravenous Continuous Saundra Shelling, MD 75 mL/hr at 05/05/18 0559    . MEDLINE mouth rinse  15 mL Mouth Rinse q12n4p Saundra Shelling, MD   15 mL at 05/04/18 2201  . ondansetron (ZOFRAN) tablet 4 mg  4 mg Oral Q6H PRN Lance Coon, MD       Or  . ondansetron [facility]) injection 4 mg  4 mg Intravenous Q6H PRN Lance Coon, MD   4 mg at 05/02/18 1754  . pantoprazole (PROTONIX) injection 40 mg  40 mg Intravenous Q24H Lance Coon, MD   40 mg at 05/05/18 0131  . piperacillin-tazobactam (ZOSYN) IVPB 3.375 g  3.375 g Intravenous Q8H Pyreddy, Pavan, MD 12.5 mL/hr at 05/05/18 0600 3.375 g at 05/05/18 0600     Discharge Medications: Please see discharge summary for  a list of discharge medications.  Relevant Imaging Results:  Relevant Lab Results:   Additional Information SSN: [id number]  Annamaria Boots, [place]

## 2018-05-05 NOTE — Evaluation (Addendum)
Objective Swallowing Evaluation: Type of Study: MBS-Modified Barium Swallow Study   Patient Details  Name: Bob [name] MRN: [id number] Date of Birth: 11/27/1945  Today's Date: 05/05/2018 Time: SLP Start Time (ACUTE ONLY): 1215 -SLP Stop Time (ACUTE ONLY): 1345  SLP Time Calculation (min) (ACUTE ONLY): 90 min   Past Medical History:  Past Medical History:  Diagnosis Date  . Back pain   . Cancer ([place])   . Cerebral palsy ([place])   . DDD (degenerative disc disease), lumbar   . Depression   . Depression   . GERD (gastroesophageal reflux disease)   . Mild cognitive disorder   . Other intervertebral disc degeneration, lumbar region    Past Surgical History:  Past Surgical History:  Procedure Laterality Date  . TOTAL HIP ARTHROPLASTY     HPI: Pt is a 73 y.o. 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 Workup reveals pneumonia, ongoing Regurgitation and vomiting of food/liquids w/ risk for aspiration, concern for aspiration, severe hypernatremia. Per report, family has noticed a great decrease in his eating for quite some time now and he has lost a great amount of weight over the last year. They note that his decline has increased since starting chemotherapy- which they expected- but they were hoping the chemotherapy would eventually improve his ability to take in food and enable him to regain his strength. Pt requires assistance w/ ADLs.   Subjective: pt sitting in MBSS chair; appeared weak, fatigued; needed full assistance w/ positioning midline    Assessment / Plan / Recommendation  CHL IP CLINICAL IMPRESSIONS 05/05/2018  Clinical Impression Pt appears to present w/ severe oropharyngeal and Esophageal phase dysphagia w/ high risk for aspiration of bolus material remaining in the pharynx d/t an apparent  incomplete swallow function. This is present despite bolus consistency. It appeared the UES did NOT relax sufficiently during the swallow to allow bolus material to move effective and safely from the pharynx into the Esophagus. During the oral phase, Moderate deficits noted including lingual pumping; reduced lingual movements; delayed A-P transit w/ all consistencies; decreased bolus control and cohesion w/ premature spillage. Oral residue remaining post swallow attempts. Encouraged pt to use a f/u, dry swallow in attempts to clear the oral cavity of residue; these f/u swallow attempts were challenging and not consistent. During the pharyngeal phase, reduced laryngeal excursion and pharyngeal pressure noted during the swallow; reduced epiglottic movement/inversion - w/ increased effort, more movement was noted. Inefficient bolus propulsion and clearing of the pharynx w/ moderate+ bolus residue remaining in both the valleculae and pyriform sinuses. There appeared to be decreased UES opening/relaxing impacting efficient movement of bolus material from the pharynx into the Esophagus - suspect this presentation could have been impacting the amount of pharyngeal phase/clearing of bolus material in the pharynx. Again, f/u, dry swallows were challenging for pt to complete to attempt to clear the oropharyngeal residue. During the Esophageal phase, there is impact on the Esophageal motility d/t the Lower Esophageal Mass (Cancer). Unsure what deficits appear to be hampering UES relaxation and movement of bolus material into the Esophagus.  Recommend continue NPO status d/t risk for aspiration; continued discussion w/ MD and Palliative Care Nurse for [place] including discussion of alternative means of feeding for nutritional/hydration needs(if desired). Recommend an ENT consult to direct view the pharyngoesophageal area including the efficacy of UES function. Recommend  frequent oral care for hygiene and stimulation of  swallowing; comfort. Met w/ brother in law for full discussion of MBSS results and recommendations; viewed MBSS video and answered questions. NSG updated.   SLP Visit Diagnosis Dysphagia, oropharyngeal phase (R13.12);Dysphagia, pharyngoesophageal phase (R13.14)  Attention and concentration deficit following --  Frontal lobe and executive function deficit following --  Impact on safety and function Severe aspiration risk;Risk for inadequate nutrition/hydration      CHL IP TREATMENT RECOMMENDATION 05/05/2018  Treatment Recommendations Patient unable to participate in swallow therapy at this time     Prognosis 05/05/2018  Prognosis for Safe Diet Advancement Guarded  Barriers to Reach Goals Severity of deficits;Time post onset  Barriers/Prognosis Comment could consider an ENT assessment for further investigation    CHL IP DIET RECOMMENDATION 05/05/2018  SLP Diet Recommendations NPO  Liquid Administration via --  Medication Administration Via alternative means  Compensations --  Postural Changes --      CHL IP OTHER RECOMMENDATIONS 05/05/2018  Recommended Consults Consider ENT evaluation  Oral Care Recommendations Oral care QID;Staff/trained caregiver to provide oral care  Other Recommendations (No Data)      CHL IP FOLLOW UP RECOMMENDATIONS 05/05/2018  Follow up Recommendations (No Data)      CHL IP FREQUENCY AND DURATION 05/05/2018  Speech Therapy Frequency (ACUTE ONLY) (No Data)  Treatment Duration (No Data)           CHL IP ORAL PHASE 05/05/2018  Oral Phase Impaired  Oral - Pudding Teaspoon --  Oral - Pudding Cup --  Oral - Honey Teaspoon --  Oral - Honey Cup --  Oral - Nectar Teaspoon --  Oral - Nectar Cup --  Oral - Nectar Straw --  Oral - Thin Teaspoon --  Oral - Thin Cup --  Oral - Thin Straw --  Oral - Puree --  Oral - Mech Soft --  Oral - Regular --  Oral - Multi-Consistency --  Oral - Pill --  Oral Phase - Comment lingual pumping; reduced movements; delayed  A-P transit w/ all consistencies; decreased bolus control and cohesion w/ premature spillage. Oral residue remaining post swallow attempts.    CHL IP PHARYNGEAL PHASE 05/05/2018  Pharyngeal Phase Impaired  Pharyngeal- Pudding Teaspoon --  Pharyngeal --  Pharyngeal- Pudding Cup --  Pharyngeal --  Pharyngeal- Honey Teaspoon --  Pharyngeal --  Pharyngeal- Honey Cup --  Pharyngeal --  Pharyngeal- Nectar Teaspoon --  Pharyngeal --  Pharyngeal- Nectar Cup --  Pharyngeal --  Pharyngeal- Nectar Straw --  Pharyngeal --  Pharyngeal- Thin Teaspoon --  Pharyngeal --  Pharyngeal- Thin Cup --  Pharyngeal --  Pharyngeal- Thin Straw --  Pharyngeal --  Pharyngeal- Puree --  Pharyngeal --  Pharyngeal- Mechanical Soft --  Pharyngeal --  Pharyngeal- Regular --  Pharyngeal --  Pharyngeal- Multi-consistency --  Pharyngeal --  Pharyngeal- Pill --  Pharyngeal --  Pharyngeal Comment reduced laryngeal excursion and pharyngeal pressure during the swallow; reduced epiglottic movement/inversion - w/ increased effort, more movement was noted. Inefficient bolus propulsion and clearing of the pharynx w/ moderate+ pharyngeal residue remaining in both the valleculae and pyriform sinuses. There appeared to be decreased UES opening and efficient movement of bolus material into the Esophagus - suspect this presentation could have been impacting the pharyngeal phase/clearing of bolus material.      CHL IP CERVICAL ESOPHAGEAL PHASE 05/05/2018  Cervical Esophageal Phase Impaired  Pudding Teaspoon --  Pudding Cup --  Honey Teaspoon --  Honey  Cup --  Hotel manager Cup --  [employer] --  Thin Teaspoon --  Thin Cup --  Thin Straw --  Puree --  Mechanical Soft --  Regular --  Multi-consistency --  Pill --  Cervical Esophageal Comment There appeared to be decreased UES opening and efficient movement of bolus material into the Esophagus - suspect this presentation could have been impacting the  pharyngeal phase/clearing of bolus material.            Orinda Kenner, MS, CCC-SLP Jerzy Roepke 05/05/2018, 6:10 PM

## 2018-05-05 NOTE — Progress Notes (Signed)
   05/05/18 1802  Vitals  Temp 98.3 F (36.8 C)  Temp Source Oral  BP (!) 85/67  MAP (mmHg) 75  BP Location Left Arm  BP Method Automatic  Patient Position (if appropriate) Lying  Pulse Rate 65  Resp 18  Oxygen Therapy  SpO2 95 %  O2 Device Nasal Cannula  O2 Flow Rate (L/min) 5 L/min  MEWS Score  MEWS RR 0  MEWS Pulse 0  MEWS Systolic 1  MEWS LOC 0  MEWS Temp 0  MEWS Score 1  MEWS Score Color Green   Made Dr. Posey Pronto aware of patient's current BP, verbal order to infuse D5 at 999ml/hr for remaining 661ml in the IV bag.  Will recheck BP when infusion complete.  Clarise Cruz, BSN

## 2018-05-05 NOTE — Progress Notes (Signed)
PT Cancellation Note  Patient Details Name: Bob [name] MRN: [id number] DOB: August 22, 1945   Cancelled Treatment:    Reason Eval/Treat Not Completed: Medical issues which prohibited therapy.  Pt's Na remains above guidelines for participation with PT services.  Will attempt to see pt at a future date/time as medically appropriate.    Heloise Beecham [name] PT, DPT 05/05/18, 8:16 AM

## 2018-05-06 DIAGNOSIS — R1310 Dysphagia, unspecified: Secondary | ICD-10-CM

## 2018-05-06 DIAGNOSIS — R4182 Altered mental status, unspecified: Secondary | ICD-10-CM

## 2018-05-06 DIAGNOSIS — G809 Cerebral palsy, unspecified: Secondary | ICD-10-CM

## 2018-05-06 DIAGNOSIS — R531 Weakness: Secondary | ICD-10-CM

## 2018-05-06 LAB — BASIC METABOLIC PANEL
Anion gap: 8 (ref 5–15)
Anion gap: 6 (ref 5–15)
BUN: 22 mg/dL (ref 8–23)
BUN: 19 mg/dL (ref 8–23)
CO2: 26 mmol/L (ref 22–32)
CO2: 26 mmol/L (ref 22–32)
Calcium: 8.4 mg/dL — ABNORMAL LOW (ref 8.9–10.3)
Calcium: 8.7 mg/dL — ABNORMAL LOW (ref 8.9–10.3)
Chloride: 120 mmol/L — ABNORMAL HIGH (ref 98–111)
Chloride: 117 mmol/L — ABNORMAL HIGH (ref 98–111)
Creatinine, Ser: 0.83 mg/dL (ref 0.61–1.24)
Creatinine, Ser: 0.82 mg/dL (ref 0.61–1.24)
GFR calc Af Amer: >60 mL/min (ref >60)
GFR calc Af Amer: >60 mL/min (ref >60)
GFR calc non Af Amer: >60 mL/min (ref >60)
Glucose, Bld: 167 mg/dL — ABNORMAL HIGH (ref 70–99)
Glucose, Bld: 142 mg/dL — ABNORMAL HIGH (ref 70–99)
Potassium: 2.6 mmol/L — CL (ref 3.5–5.1)
Potassium: 3.2 mmol/L — ABNORMAL LOW (ref 3.5–5.1)
Sodium: 154 mmol/L — ABNORMAL HIGH (ref 135–145)
Sodium: 149 mmol/L — ABNORMAL HIGH (ref 135–145)

## 2018-05-06 LAB — CULTURE, BLOOD (ROUTINE X 2)
CULTURE: NO GROWTH
CULTURE: NO GROWTH
Special Requests: ADEQUATE

## 2018-05-06 LAB — CBC WITH DIFFERENTIAL/PLATELET
Abs Immature Granulocytes: 0.03 10*3/uL (ref 0.00–0.07)
BASOS PCT: 1 %
Basophils Absolute: 0 10*3/uL (ref 0.0–0.1)
Eosinophils Absolute: 0 10*3/uL (ref 0.0–0.5)
Eosinophils Relative: 2 %
HCT: 28.1 % — ABNORMAL LOW (ref 39.0–52.0)
Hemoglobin: 7.9 g/dL — ABNORMAL LOW (ref 13.0–17.0)
Immature Granulocytes: 2 %
Lymphocytes Relative: 7 %
Lymphs Abs: 0.1 10*3/uL — ABNORMAL LOW (ref 0.7–4.0)
MCH: 25.8 pg — ABNORMAL LOW (ref 26.0–34.0)
MCHC: 28.1 g/dL — ABNORMAL LOW (ref 30.0–36.0)
MCV: 91.8 fL (ref 80.0–100.0)
MONOS PCT: 5 %
Monocytes Absolute: 0.1 10*3/uL (ref 0.1–1.0)
NEUTROS PCT: 83 %
Neutro Abs: 1.4 10*3/uL — ABNORMAL LOW (ref 1.7–7.7)
Platelets: 78 10*3/uL — ABNORMAL LOW (ref 150–400)
RBC: 3.06 MIL/uL — ABNORMAL LOW (ref 4.22–5.81)
RDW: 18.9 % — ABNORMAL HIGH (ref 11.5–15.5)
WBC: 1.6 10*3/uL — ABNORMAL LOW (ref 4.0–10.5)
nRBC: 1.2 % — ABNORMAL HIGH (ref 0.0–0.2)

## 2018-05-06 LAB — SODIUM
SODIUM: 149 mmol/L — AB (ref 135–145)
Sodium: 150 mmol/L — ABNORMAL HIGH (ref 135–145)

## 2018-05-06 LAB — MAGNESIUM: Magnesium: 1.9 mg/dL (ref 1.7–2.4)

## 2018-05-06 LAB — POTASSIUM: Potassium: 3.2 mmol/L — ABNORMAL LOW (ref 3.5–5.1)

## 2018-05-06 MED ORDER — BISACODYL 10 MG RE SUPP: 10.0000 mg | Freq: Every day | RECTAL | Status: DC | PRN

## 2018-05-06 MED ORDER — POTASSIUM CHLORIDE 10 MEQ/100ML IV SOLN
10.0000 meq | INTRAVENOUS | Status: AC
  Administered 2018-05-06 (×6): 10 meq via INTRAVENOUS
  Filled 2018-05-06 (×5): qty 100

## 2018-05-06 MED ORDER — POTASSIUM CHLORIDE 10 MEQ/100ML IV SOLN: 10.0000 meq | INTRAVENOUS | Status: DC

## 2018-05-06 NOTE — Progress Notes (Signed)
[facility]  ROUNDING NOTE   Subjective:  Sodium now down to 149. Potassium mildly low at 3.2 and patient receiving repletion. WBC count remains low at 1.1.   Objective:  Vital signs in last 24 hours:  Temp:  [98.3 F (36.8 C)-98.5 F (36.9 C)] 98.5 F (36.9 C) (02/28 0444) Pulse Rate:  [57-65] 59 (02/28 0444) Resp:  [16-20] 20 (02/28 0444) BP: (85-102)/(56-67) 99/64 (02/28 0444) SpO2:  [95 %-100 %] 100 % (02/28 0444)  Weight change:  Filed Weights   05/01/18 2047  Weight: 75.8 kg    Intake/Output: I/O last 3 completed shifts: In: 6142.1 [I.V.:4730.4; IV Piggyback:1411.8] Out: 2100 [Urine:2100]   Intake/Output this shift:  No intake/output data recorded.  Physical Exam: General: No acute distress  Head: Normocephalic, atraumatic. Dry oral mucosal membranes  Eyes: Anicteric  Neck: Supple, trachea midline  Lungs:  Bilateral rhonchi/rales, normal effort  Heart: S1S2 no rubs  Abdomen:  Soft, nontender, bowel sounds present  Extremities: No peripheral edema.  Neurologic: Awake, alert, following commands  Skin: No lesions       Basic Metabolic Panel: Recent Labs  Lab 05/01/18 2051 05/02/18 0458 05/03/18 0547  05/05/18 1341 05/05/18 1715 05/05/18 2139 05/06/18 0115 05/06/18 0931 05/06/18 1052  NA 152* 155* 167*   < > 163* 161* 155* 154* 149*  --   K 4.4 4.5 3.7  --   --   --   --  2.6* 3.2* 3.2*  CL 115* 121* >130*  --   --   --   --  120* 117*  --   CO2 28 25 27   --   --   --   --  26 26  --   GLUCOSE 116* 88 114*  --   --   --   --  167* 142*  --   BUN 40* 39* 24*  --   --   --   --  22 19  --   CREATININE 1.89* 1.46* 1.02  --   --   --   --  0.83 0.82  --   CALCIUM 9.4 8.5* 9.5  --   --   --   --  8.4* 8.7*  --   MG  --   --   --   --   --   --   --   --   --  1.9   < > = values in this interval not displayed.    Liver Function Tests: Recent Labs  Lab 05/01/18 2051  AST 58*  ALT 25  ALKPHOS 55  BILITOT 0.5  PROT 6.9  ALBUMIN  3.1*   No results for input(s): LIPASE, AMYLASE in the last 168 hours. No results for input(s): AMMONIA in the last 168 hours.  CBC: Recent Labs  Lab 05/01/18 2051 05/02/18 0458 05/03/18 0547 05/05/18 0126  WBC 0.6* 0.6*  0.6* 0.7* 1.1*  NEUTROABS  --  0.5*  --   --   HGB 9.7* 8.0*  8.0* 7.9* 8.7*  HCT 33.3* 28.4*  27.7* 28.3* 31.4*  MCV 89.3 92.8  90.2 92.2 92.9  PLT 83* 61*  69* 71* 78*    Cardiac Enzymes: Recent Labs  Lab 05/01/18 2051  TROPONINI 0.07*    BNP: Invalid input(s): POCBNP  CBG: No results for input(s): GLUCAP in the last 168 hours.  Microbiology: Results for orders placed or performed during the hospital encounter of 05/01/18  Blood culture (routine x 2)  Status: None   Collection Time: 05/01/18 10:36 PM  Result Value Ref Range Status   Specimen Description BLOOD LEFT HAND  Final   Special Requests   Final    BOTTLES DRAWN AEROBIC ONLY Blood Culture results may not be optimal due to an inadequate volume of blood received in culture bottles   Culture   Final    NO GROWTH 5 DAYS Performed at [facility], [address] [place], [place] [address]    Report Status 05/06/2018 FINAL  Final  Blood culture (routine x 2)     Status: None   Collection Time: 05/01/18 10:45 PM  Result Value Ref Range Status   Specimen Description BLOOD RIGHT HAND  Final   Special Requests   Final    BOTTLES DRAWN AEROBIC AND ANAEROBIC Blood Culture adequate volume   Culture   Final    NO GROWTH 5 DAYS Performed at [facility], [address] [place], [place] [address]    Report Status 05/06/2018 FINAL  Final  MRSA PCR Screening     Status: None   Collection Time: 05/02/18  5:05 PM  Result Value Ref Range Status   MRSA by PCR NEGATIVE NEGATIVE Final    Comment:        The GeneXpert MRSA Assay (FDA approved for NASAL specimens only), is one component of a comprehensive MRSA colonization surveillance program. It is not intended to  diagnose MRSA infection nor to guide or monitor treatment for MRSA infections. Performed at [facility], [address] [place], [place] [address]     Coagulation Studies: No results for input(s): LABPROT, INR in the last 72 hours.  Urinalysis: No results for input(s): COLORURINE, LABSPEC, PHURINE, GLUCOSEU, HGBUR, BILIRUBINUR, KETONESUR, PROTEINUR, UROBILINOGEN, NITRITE, LEUKOCYTESUR in the last 72 hours.  Invalid input(s): APPERANCEUR    Imaging: No results found.   Medications:   . sodium chloride Stopped (05/06/18 0655)  . dextrose 200 mL/hr at 05/06/18 1040  . piperacillin-tazobactam (ZOSYN)  IV 12.5 mL/hr at 05/06/18 0655  . potassium chloride 10 mEq (05/06/18 1151)   . magic mouthwash  5 mL Oral TID  . mouth rinse  15 mL Mouth Rinse q12n4p  . pantoprazole (PROTONIX) IV  40 mg Intravenous Q24H   sodium chloride, bisacodyl, ondansetron **OR** ondansetron (ZOFRAN) IV  Assessment/ Plan:  73 y.o. male with a PMHx of cerebral palsy, lumbar degenerative disc disease, depression, GERD, esophageal cancer, who was admitted to [facility] on 05/01/2018 for evaluation of shortness of breath and altered mental status.    1.  Severe hypernatremia. 2.  Acute renal failure, improving. 3.  Esophogeal cancer.  4.  Aspiration pneumonia.   Plan: serum sodium appears to be improving.  Sodium down to 149.  Acute renal failure appears to have resolved.  Patient having significant difficulties with swallowing food and also having aspiration.  Therefore he has been made n.p.o. Continue D5W at 200 ccper hour for now and we will likely decrease the rate starting tomorrow.  Patient's sister also arriving soon and will help to make decisions.   LOS: 5 Bob [name] 2/28/202012:44 PM

## 2018-05-06 NOTE — Progress Notes (Signed)
Hematology/Oncology Consult note [facility]  [phone] [phone]  Patient Care Team: Patient, No Pcp Per as PCP - General (General Practice)   Name of the patient: Rizwan Kuyper  [id number]  1945-09-02   Date of visit: 05/06/2018  Interval history-patient seems more alert at the bedside today.  Appears comfortable and denies any pain  ECOG PS- 3 Pain scale- 0   Review of systems-limited as he is a poor historian speech is garbled.  Baseline cerebral palsy   Review of Systems  Constitutional: Positive for malaise/fatigue. Negative for chills, fever and weight loss.  HENT: Negative for congestion, ear discharge and nosebleeds.   Eyes: Negative for blurred vision.  Respiratory: Negative for cough, hemoptysis, sputum production, shortness of breath and wheezing.   Cardiovascular: Negative for chest pain, palpitations, orthopnea and claudication.  Gastrointestinal: Negative for abdominal pain, blood in stool, constipation, diarrhea, heartburn, melena, nausea and vomiting.  Genitourinary: Negative for dysuria, flank pain, frequency, hematuria and urgency.  Musculoskeletal: Negative for back pain, joint pain and myalgias.  Skin: Negative for rash.  Neurological: Negative for dizziness, tingling, focal weakness, seizures, weakness and headaches.  Endo/Heme/Allergies: Does not bruise/bleed easily.  Psychiatric/Behavioral: Negative for depression and suicidal ideas. The patient does not have insomnia.       No Known Allergies   Past Medical History:  Diagnosis Date  . Back pain   . Cancer ([place])   . Cerebral palsy ([place])   . DDD (degenerative disc disease), lumbar   . Depression   . Depression   . GERD (gastroesophageal reflux disease)   . Mild cognitive disorder   . Other intervertebral disc degeneration, lumbar region      Past Surgical History:  Procedure Laterality Date  . TOTAL HIP ARTHROPLASTY      Social History    Socioeconomic History  . Marital status: Widowed    Spouse name: Not on file  . Number of children: Not on file  . Years of education: Not on file  . Highest education level: Not on file  Occupational History  . Not on file  Social Needs  . Financial resource strain: Not on file  . Food insecurity:    Worry: Not on file    Inability: Not on file  . Transportation needs:    Medical: Not on file    Non-medical: Not on file  Tobacco Use  . Smoking status: Former Research scientist (life sciences)  . Smokeless tobacco: Never Used  Substance and Sexual Activity  . Alcohol use: No  . Drug use: No  . Sexual activity: Not on file  Lifestyle  . Physical activity:    Days per week: Not on file    Minutes per session: Not on file  . Stress: Not on file  Relationships  . Social connections:    Talks on phone: Not on file    Gets together: Not on file    Attends religious service: Not on file    Active member of club or organization: Not on file    Attends meetings of clubs or organizations: Not on file    Relationship status: Not on file  . Intimate partner violence:    Fear of current or ex partner: Not on file    Emotionally abused: Not on file    Physically abused: Not on file    Forced sexual activity: Not on file  Other Topics Concern  . Not on file  Social History Narrative  . Not  on file    History reviewed. No pertinent family history.   Current Facility-Administered Medications:  .  0.9 %  sodium chloride infusion, , Intravenous, PRN, Saundra Shelling, MD, Stopped at 05/06/18 6283 .  bisacodyl (DULCOLAX) suppository 10 mg, 10 mg, Rectal, Daily PRN, Pyreddy, Pavan, MD .  dextrose 5 % solution, , Intravenous, Continuous, Lateef, Munsoor, MD, Last Rate: 200 mL/hr at 05/06/18 1040 .  magic mouthwash, 5 mL, Oral, TID, Mahan, Kasie J, NP, 5 mL at 05/06/18 1004 .  MEDLINE mouth rinse, 15 mL, Mouth Rinse, q12n4p, Pyreddy, Pavan, MD, 15 mL at 05/06/18 1004 .  ondansetron (ZOFRAN) tablet 4 mg, 4 mg,  Oral, Q6H PRN **OR** ondansetron (ZOFRAN) injection 4 mg, 4 mg, Intravenous, Q6H PRN, Lance Coon, MD, 4 mg at 05/02/18 1754 .  pantoprazole (PROTONIX) injection 40 mg, 40 mg, Intravenous, Q24H, Lance Coon, MD, 40 mg at 05/05/18 2341 .  piperacillin-tazobactam (ZOSYN) IVPB 3.375 g, 3.375 g, Intravenous, Q8H, Pyreddy, Pavan, MD, Last Rate: 12.5 mL/hr at 05/06/18 0655  Physical exam:  Vitals:   05/05/18 1842 05/05/18 1845 05/05/18 1955 05/06/18 0444  BP: (!) 97/58 (!) 102/56 93/64 99/64   Pulse: (!) 57 (!) 57 (!) 58 (!) 59  Resp:   16 20  Temp:    98.5 F (36.9 C)  TempSrc:      SpO2:   96% 100%  Weight:      Height:       Physical Exam Constitutional:      Comments: Thin frail gentleman in no acute distress  HENT:     Head: Normocephalic and atraumatic.  Eyes:     Pupils: Pupils are equal, round, and reactive to light.  Neck:     Musculoskeletal: Normal range of motion.  Cardiovascular:     Rate and Rhythm: Normal rate and regular rhythm.     Heart sounds: Normal heart sounds.  Pulmonary:     Effort: Pulmonary effort is normal.     Breath sounds: Normal breath sounds.  Abdominal:     General: Bowel sounds are normal.     Palpations: Abdomen is soft.  Skin:    General: Skin is warm and dry.  Neurological:     Mental Status: He is alert.     Comments: Spasticity from cerebral palsy      CMP Latest Ref Rng & Units 05/06/2018  Glucose 70 - 99 mg/dL -  BUN 8 - 23 mg/dL -  Creatinine 0.61 - 1.24 mg/dL -  Sodium 135 - 145 mmol/L -  Potassium 3.5 - 5.1 mmol/L 3.2(L)  Chloride 98 - 111 mmol/L -  CO2 22 - 32 mmol/L -  Calcium 8.9 - 10.3 mg/dL -  Total Protein 6.5 - 8.1 g/dL -  Total Bilirubin 0.3 - 1.2 mg/dL -  Alkaline Phos 38 - 126 U/L -  AST 15 - 41 U/L -  ALT 0 - 44 U/L -   CBC Latest Ref Rng & Units 05/05/2018  WBC 4.0 - 10.5 K/uL 1.1(LL)  Hemoglobin 13.0 - 17.0 g/dL 8.7(L)  Hematocrit 39.0 - 52.0 % 31.4(L)  Platelets 150 - 400 K/uL 78(L)    @IMAGES @  Ct  Head Wo Contrast  Result Date: 05/01/2018 CLINICAL DATA:  Syncopal episode.  Altered mental status EXAM: CT HEAD WITHOUT CONTRAST TECHNIQUE: Contiguous axial images were obtained from the base of the skull through the vertex without intravenous contrast. COMPARISON:  06/20/2017 FINDINGS: Brain: No acute intracranial abnormality. Specifically, no hemorrhage, hydrocephalus, mass lesion, acute  infarction, or significant intracranial injury. Vascular: No hyperdense vessel or unexpected calcification. Skull: No acute calvarial abnormality. Sinuses/Orbits: Visualized paranasal sinuses and mastoids clear. Orbital soft tissues unremarkable. Other: None IMPRESSION: No acute intracranial abnormality. Electronically Signed   By: Rolm Baptise M.D.   On: 05/01/2018 21:57   Ct Angio Chest Pe W And/or Wo Contrast  Result Date: 05/01/2018 CLINICAL DATA:  Weakness and hypoxia. EXAM: CT ANGIOGRAPHY CHEST WITH CONTRAST TECHNIQUE: Multidetector CT imaging of the chest was performed using the standard protocol during bolus administration of intravenous contrast. Multiplanar CT image reconstructions and MIPs were obtained to evaluate the vascular anatomy. CONTRAST:  58mL OMNIPAQUE IOHEXOL 350 MG/ML SOLN COMPARISON:  None. FINDINGS: Cardiovascular: Good opacification of the central and segmental pulmonary arteries. No focal filling defects. No evidence of significant pulmonary embolus. Normal heart size. No pericardial effusion. Normal caliber thoracic aorta. No aortic dissection. Great vessel origins are patent. Mediastinum/Nodes: The esophagus is diffusely dilated and filled with fluid and ingested material. Dilatation extends to the esophagogastric junction. Differential diagnosis would include achalasia, distal stricture, or distal esophageal carcinoma. Mildly prominent lymph nodes noted at the EG junction may favor tumor. Consider upper endoscopy for further evaluation. Lungs/Pleura: Patchy infiltration and consolidation in the  lung bases may represent aspiration or pneumonia. No pleural effusions. No pneumothorax. Airways are patent. Upper Abdomen: No acute abnormalities identified. Musculoskeletal: Severe degenerative changes in both shoulders with bilateral shoulder effusions, greater on the left. Degenerative changes in the spine. Review of the MIP images confirms the above findings. IMPRESSION: 1. No evidence of significant pulmonary embolus. 2. Dilated fluid-filled esophagus extending to the esophagogastric junction. Differential diagnosis would include achalasia, distal stricture, or distal esophageal carcinoma. Mild prominence of lymph nodes in the EG junction may favor tumor. Consider upper endoscopy for further evaluation. 3. Patchy infiltration and consolidation in the lung bases may represent aspiration or pneumonia. 4. Severe degenerative changes in both shoulders with bilateral shoulder effusions, greater on the left. Electronically Signed   By: Lucienne Capers M.D.   On: 05/01/2018 23:48   Dg Chest Portable 1 View  Result Date: 05/01/2018 CLINICAL DATA:  Syncope EXAM: PORTABLE CHEST 1 VIEW COMPARISON:  01/02/2018 FINDINGS: 2110 hours. Low volumes. Cardiopericardial silhouette is at upper limits of normal for size. Bibasilar atelectasis or infiltrate noted. The visualized bony structures of the thorax are intact. Degenerative changes evident in both shoulders. Telemetry leads overlie the chest. IMPRESSION: Low volume film with bibasilar atelectasis or infiltrate. Electronically Signed   By: Misty Stanley M.D.   On: 05/01/2018 21:43     Assessment and plan- Patient is a 73 y.o. 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  He has been admitted for generalized weakness and altered mental status as well as hypernatremia  1.  Pancytopenia: Likely secondary to chemotherapy.  Patient  probably has a poor marrow reserve and is taking a while to recover his counts.  CBC checked yesterday showed a white count of 1.1 which was improved from a prior white count of 0.65 days ago.  Also his platelet counts have Remained stable between 70s to 80s.  His ANC 3 days ago was 500.  There is a concern for aspiration and plan was to insert a PEG tube for which GI was consulted.  I would recommend checking a CBC with differential today and if his ANC remains less  than 1 then I would recommend giving him Neupogen 300 mcg daily until [place] is greater than 1 prior to PEG tube placement when such a decision is made.  However patient's family at this time is undecided about whether they would like to proceed with a PEG tube.  They would like to first see if his mental status and swallowing improves after correction of hypernatremia  2.  Hypernatremia: He is on D5W at 150 cc an hour and is currently being managed by nephrology   3.  He is currently being treated with IV Zosyn for possible aspiration pneumonia   Visit Diagnosis 1. Pancytopenia 2. Hypernatremia     Dr. Randa Evens, MD, MPH [facility] at [facility] [phone] 05/06/2018 2:01 PM

## 2018-05-06 NOTE — Progress Notes (Addendum)
[place] [facility] [place] NAME: Bob [name]    MR#:  [id number]  DATE OF BIRTH:  1945-05-15  SUBJECTIVE:  CHIEF COMPLAINT:   Chief Complaint  Patient presents with  . Shortness of Breath  . Altered Mental Status  Seen and evaluated today Has been kept npo to avoid further aspiration Has decreased cough and looks better On oxygen via nasal cannula at 4 L No fever  REVIEW OF SYSTEMS:    ROS  CONSTITUTIONAL: No documented fever. Has fatigue, weakness. No weight gain, no weight loss.  EYES: No blurry or double vision.  ENT: No tinnitus. No postnasal drip. No redness of the oropharynx.  RESPIRATORY: Has cough, no wheeze, no hemoptysis. Has dyspnea.  CARDIOVASCULAR: No chest pain. No orthopnea. No palpitations. No syncope.  GASTROINTESTINAL: No nausea, no vomiting or diarrhea. No abdominal pain. No melena or hematochezia.  GENITOURINARY: No dysuria or hematuria.  ENDOCRINE: No polyuria or nocturia. No heat or cold intolerance.  HEMATOLOGY: No anemia. No bruising. No bleeding.  INTEGUMENTARY: No rashes. No lesions.  MUSCULOSKELETAL: No arthritis. No swelling. No gout.  NEUROLOGIC: No numbness, tingling, or ataxia. No seizure-type activity.  PSYCHIATRIC: No anxiety. No insomnia. No ADD.   DRUG ALLERGIES:  No Known Allergies  VITALS:  Blood pressure 99/64, pulse (!) 59, temperature 98.5 F (36.9 C), resp. rate 20, height 5\' 7"  (1.702 m), weight 75.8 kg, SpO2 100 %.  PHYSICAL EXAMINATION:   Physical Exam  GENERAL:  73 y.o.-year-old patient lying in the bed oxygen via nasal cannula at 4 L EYES: Pupils equal, round, reactive to light and accommodation. No scleral icterus. Extraocular muscles intact.  HEENT: Head atraumatic, normocephalic. Oropharynx dry and nasopharynx clear.  NECK:  Supple, no jugular venous distention. No thyroid enlargement, no tenderness.  LUNGS: Improved breath sounds bilaterally, bilateral decreased rales heard. No  use of accessory muscles of respiration.  CARDIOVASCULAR: S1, S2 normal. No murmurs, rubs, or gallops.  ABDOMEN: Soft, nontender, nondistended. Bowel sounds present. No organomegaly or mass.  EXTREMITIES: No cyanosis, clubbing or edema b/l.    NEUROLOGIC: Cranial nerves II through XII are intact. No focal Motor or sensory deficits b/l.   PSYCHIATRIC: The patient is alert and oriented x 2.  SKIN: No obvious rash, lesion, or ulcer.   LABORATORY PANEL:   CBC Recent Labs  Lab 05/05/18 0126  WBC 1.1*  HGB 8.7*  HCT 31.4*  PLT 78*   ------------------------------------------------------------------------------------------------------------------ Chemistries  Recent Labs  Lab 05/01/18 2051  05/06/18 0931  NA 152*   < > 149*  K 4.4   < > 3.2*  CL 115*   < > 117*  CO2 28   < > 26  GLUCOSE 116*   < > 142*  BUN 40*   < > 19  CREATININE 1.89*   < > 0.82  CALCIUM 9.4   < > 8.7*  AST 58*  --   --   ALT 25  --   --   ALKPHOS 55  --   --   BILITOT 0.5  --   --    < > = values in this interval not displayed.   ------------------------------------------------------------------------------------------------------------------  Cardiac Enzymes Recent Labs  Lab 05/01/18 2051  TROPONINI 0.07*   ------------------------------------------------------------------------------------------------------------------  RADIOLOGY:  No results found.   ASSESSMENT AND PLAN:  73 year old male patient with 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, GERD, cerebral palsy currently under hospitalist service for shortness of breath  -Sepsis  secondary to pneumonia improved Secondary to aspiration On IV Zosyn antibiotic currently As patient is npo, cant switch to oral therapy NPO for now to avoid more aspiration episodes  -Acute hypernatremia improving Current sodium is 149 Will decrease IV fluid rate Serial sodium monitoring Nephrology evaluation and follow up  appreciated  -Aspiration pneumonia IV Zosyn antibiotic currently on board Pureed diet on hold Discussed with family GI consult for possible PEG tube placement ANC level has to be greater than 1 for consideration of PEG tube placement Recheck WBC count If ANC less than 1 will start Neupogen 300 mcg as per oncology recommendation  -Pancytopenia secondary to recent chemotherapy Oncology evaluation and neutropenic precautions Follow-up ANC count and consider Neupogen if ANC less than 1  -Esophageal cancer Oncology follow-up appreciated No further chemotherapy recommended Further follow-up with [facility] oncology  -Cerebral palsy Supportive care  -High risk of morbidity and mortality considering advanced cancer  -Palliative care consult  Appreciated [facility] not [facility] Will continue care here Family updated Update from palliative care services Family of patient do not want peg tube Transition to hospice after being treated for hypernatremia and aspiration pneumonia  All the records are reviewed and case discussed with Care Management/Social Worker. Management plans discussed with the patient, family and they are in agreement.  CODE STATUS: Full code  DVT Prophylaxis: SCDs  TOTAL TIME TAKING CARE OF THIS PATIENT: 35 minutes.   POSSIBLE D/C IN 2 to 3 DAYS, DEPENDING ON CLINICAL CONDITION.  Saundra Shelling M.D on 05/06/2018 at 10:49 AM  Between 7am to 6pm - Pager - [phone]  After 6pm go to [email or web address] - password [email or web address] [place]  Office  [phone]  CC: Primary care physician; Patient, No Pcp Per  Note: This dictation was prepared with Dragon dictation along with smaller phrase technology. Any transcriptional errors that result from this process are unintentional.

## 2018-05-06 NOTE — Care Management Important Message (Signed)
Important Message  Patient Details  Name: Norvil Martensen MRN: [id number] Date of Birth: 1945-09-15   Medicare Important Message Given:  Yes    Juliann Pulse A Quention Mcneill 05/06/2018, 11:28 AM

## 2018-05-06 NOTE — Progress Notes (Signed)
PHARMACY CONSULT NOTE - FOLLOW UP  Pharmacy Consult for Electrolyte Monitoring and Replacement   Recent Labs: Potassium (mmol/L)  Date Value  05/06/2018 2.6 (LL)   Calcium (mg/dL)  Date Value  05/06/2018 8.4 (L)   Albumin (g/dL)  Date Value  05/01/2018 3.1 (L)   Sodium (mmol/L)  Date Value  05/06/2018 154 (H)     Assessment: Pharmacy consulted for potassium replacement. K+ is 2.6. Dr. Brett Albino ordered KCl 10 mEq X 6 IV.   Goal of Therapy:  K+ 3.5 - 5  Plan:  Will order a potassium level 1 hour after the last potassium run and replace as necessary. Will also order a Mg level with potassium level.   Oswald Hillock ,PharmD, BCPS Clinical Pharmacist 05/06/2018 8:17 AM

## 2018-05-06 NOTE — Evaluation (Signed)
Physical Therapy Evaluation Patient Details Name: Bob [name] MRN: [id number] DOB: Aug 06, 1945 Today's Date: 05/06/2018   History of Present Illness  Pt is a 73 y/o M who presented with SOB, weakness and AMS. Pt found to have Bil pneumonia and pt met sepsis criteria.  Pt additionally with acute hypernatremia.  Pt's PMH includes esphogeal cancer with chemotherapy, cerebral palsy, back pain, mild congitive disorder, THA.      Clinical Impression  Pt admitted with above diagnosis. Pt currently with functional limitations due to the deficits listed below (see PT Problem List). Bob [name] is not at his baseline level of mobility. He currently requires max +2 assist with bed mobility but pt puts forth good effort and is able to assist in initiating supine<>sit. See general comments below for SpO2 readings during session, poor reading but as low as 84% with activity.  Given pt's current mobility status, recommending SNF at d/c.  Pt will benefit from skilled PT to increase their independence and safety with mobility to allow discharge to the venue listed below.      Follow Up Recommendations SNF    Equipment Recommendations  Other (comment)(TBD at next venue of care)    Recommendations for Other Services       Precautions / Restrictions Precautions Precautions: Fall;Other (comment) Precaution Comments: monitor SpO2 Restrictions Weight Bearing Restrictions: No      Mobility  Bed Mobility Overal bed mobility: Needs Assistance Bed Mobility: Supine to Sit;Sit to Supine     Supine to sit: Max assist;+2 for physical assistance;HOB elevated Sit to supine: Max assist;+2 for physical assistance   General bed mobility comments: Pt able to initiate moving LEs to EOB and pulling on bed rail to assist with supine>sit, but minimally.  Assist provided to BLEs and to trunk for supine<>sit.   Transfers                 General transfer comment: Unable to attempt  Ambulation/Gait                 Stairs            Wheelchair Mobility    Modified Rankin (Stroke Patients Only)       Balance Overall balance assessment: Needs assistance;History of Falls Sitting-balance support: Bilateral upper extremity supported;Feet supported Sitting balance-Leahy Scale: Poor Sitting balance - Comments: Pt requires cues to avoid posterior lean and even so is only able to maintain sitting EOB with BUE and BLEs supported for ~3 seconds with min guard assist before requiring min>mod assist due to posterior lean and LOB.   Postural control: Posterior lean                                   Pertinent Vitals/Pain Pain Assessment: No/denies pain    Home Living Family/patient expects to be discharged to:: Assisted living               Home Equipment: Walker - 4 wheels Additional Comments: Pt living at [facility] and reports is handicap accessible with walk in shower, shower seat, grab bars by shower and toilet, handicap height toilet.    Prior Function Level of Independence: Needs assistance   Gait / Transfers Assistance Needed: Ambulating household distances with rollator with frequent falls, pt's family reports pt trips on RLE due to poor foot clearance.    ADL's / Homemaking Assistance Needed: Assist required for bathing, dressing.  Meals  provided by ALF but pt was ind with feeding PTA.         Hand Dominance   Dominant Hand: Right    Extremity/Trunk Assessment   Upper Extremity Assessment Upper Extremity Assessment: RUE deficits/detail;LUE deficits/detail RUE Deficits / Details: Increased tone throughout.  Bil fingers rest in flexion but pt able to open with limited range with increased time.  Impaired grip strength but otherwise strength grossly 4/5.  RUE Coordination: decreased gross motor;decreased fine motor LUE Deficits / Details: Increased tone throughout.  Bil fingers rest in flexion but pt able to open with limited range with increased  time.  Impaired grip strength but otherwise strength grossly 4/5.  LUE Coordination: decreased fine motor;decreased gross motor    Lower Extremity Assessment Lower Extremity Assessment: RLE deficits/detail;LLE deficits/detail RLE Deficits / Details: Increased tone throughout.  R ankle resting position in PF and inversion.  Pt unable to actively move out of inversion but is passively able to DF and evert close to neutral.  Strength otherwise grossly 3-/5.  RLE Coordination: decreased gross motor LLE Deficits / Details: Increased tone throughout.  Pt able to actively DF to neutral.  Strength grossly 3-/5.  LLE Coordination: decreased gross motor    Cervical / Trunk Assessment Cervical / Trunk Assessment: Kyphotic  Communication   Communication: Other (comment)(dysarthria at baseline due to h/o cerebral palsy)  Cognition Arousal/Alertness: Awake/alert Behavior During Therapy: WFL for tasks assessed/performed Overall Cognitive Status: Difficult to assess                                 General Comments: Difficult to understand pt due to dysarthria, brother in law and cousin assist PT in understanding pt.       General Comments General comments (skin integrity, edema, etc.): Pt on 5L O2 throughout session.  HR and SpO2 monitored via HR monitor.  Poor waveform for SpO2 reading via HR monitor throughout and this PT unable to use personal pulse ox due to inaccurate reading as well.  HR stable throughut.  SpO2 drops as low as 84%; however, question reading as no increased SOB and HR monitor showing very poor waveform.  SpO2 dropped x2 with bed mobility and x1 at end of sitting EOB but question reliability of reading.  Each time pt was instructed to rest and cues for pursed lip breathing with SpO2 otherwise in high 90s.      Exercises General Exercises - Lower Extremity Straight Leg Raises: Both;Strengthening;5 reps;Supine Other Exercises Other Exercises: Sitting endurance sitting  EOB with BUE and BLE support with intermittent min>mod assist for trunk control due to posterior LOB.  Pt tolerated sitting for ~4 minutes before significant fatigue, requiring return to supine.    Assessment/Plan    PT Assessment Patient needs continued PT services  PT Problem List Decreased strength;Decreased range of motion;Decreased activity tolerance;Decreased balance;Decreased coordination;Decreased mobility;Decreased cognition;Decreased knowledge of use of DME;Decreased knowledge of precautions;Decreased safety awareness;Cardiopulmonary status limiting activity;Impaired tone       PT Treatment Interventions DME instruction;Gait training;Functional mobility training;Therapeutic activities;Therapeutic exercise;Balance training;Neuromuscular re-education;Cognitive remediation;Patient/family education;Wheelchair mobility training    PT Goals (Current goals can be found in the Care Plan section)  Acute Rehab PT Goals Patient Stated Goal: to participate with therapy as much as possible PT Goal Formulation: With patient/family Time For Goal Achievement: 05/20/18 Potential to Achieve Goals: Fair    Frequency Min 2X/week   Barriers to discharge Other (comment) Unclear if  ALF will be able to meet level of physical assist the pt currently requires    Co-evaluation               AM-PAC PT "6 Clicks" Mobility  Outcome Measure Help needed turning from your back to your side while in a flat bed without using bedrails?: A Lot Help needed moving from lying on your back to sitting on the side of a flat bed without using bedrails?: A Lot Help needed moving to and from a bed to a chair (including a wheelchair)?: Total Help needed standing up from a chair using your arms (e.g., wheelchair or bedside chair)?: Total Help needed to walk in hospital room?: Total Help needed climbing 3-5 steps with a railing? : Total 6 Click Score: 8    End of Session Equipment Utilized During Treatment:  Oxygen Activity Tolerance: Patient limited by fatigue;Treatment limited secondary to medical complications (Comment)(Possible SpO2 drop, however, unclear due to poor reading) Patient left: in bed;with call bell/phone within reach;with bed alarm set;with family/visitor present Nurse Communication: Mobility status;Other (comment)(SpO2) PT Visit Diagnosis: Muscle weakness (generalized) (M62.81);History of falling (Z91.81);Repeated falls (R29.6);Unsteadiness on feet (R26.81)    Time: 4232-0094 PT Time Calculation (min) (ACUTE ONLY): 35 min   Charges:   PT Evaluation $PT Eval Moderate Complexity: 1 Mod PT Treatments $Therapeutic Activity: 8-22 mins        Collie Siad PT, DPT 05/06/2018, 2:26 PM

## 2018-05-06 NOTE — Progress Notes (Signed)
   05/06/18 0900  Clinical Encounter Type  Visited With Patient and family together  Visit Type Follow-up  Ch was rounding. Ch talked to the brother-in-law. Pt has good family support.

## 2018-05-06 NOTE — Progress Notes (Signed)
PT Cancellation Note  Patient Details Name: Bob [name] MRN: [id number] DOB: 17-Jan-1946   Cancelled Treatment:    Reason Eval/Treat Not Completed: Medical issues which prohibited therapy.  Potassium 2.6.  Per protocol, will hold PT until pt more medically appropriate for exertional activity.    Collie Siad PT, DPT 05/06/2018, 10:04 AM

## 2018-05-06 NOTE — Consult Note (Signed)
Jonathon Bellows , MD [address] [place], [place], [place], [address] [id number], [place], [place], [place], [address] Phone: [phone]  Fax: [phone]  Consultation  Referring Provider:     Dr Estanislado Pandy  Primary Care Physician:  Patient, No Pcp Per Primary Gastroenterologist:  Dr. Domenica Fail          Reason for Consultation:     PEG tube   Date of Admission:  05/01/2018 Date of Consultation:  05/06/2018         HPI:   Bob [name] is a 73 y.o. male was admitted on 05/01/2018 with shortness of breath and altered mental status.  He has a history of esophageal cancer and has been receiving chemotherapy.  As per progress note from yesterday is on oxygen at 4 L.  On 05/05/2018 white cell count is 1.1 hemoglobin of 8.7 g and a platelet count of 78.  He is being treated for pneumonia.    As per EGD in November 2019 a large ulcerating mass with no stigmata of recent bleeding was found to the gastroesophageal junction the mass was partially obstructing and partially circumferential involving one half of the lumen.)  Chemoradiation on 03/29/2018.  He follows at [facility].  CT angios chest PE protocol on 05/01/2018 demonstrated dilated fluid-filled esophagus extending to the GE junction suggesting obstruction patchy infiltration and consolidation of the lung suggestive of aspiration or pneumonia.  Last CBC with differential on 05/02/2018 demonstrated a white cell count of 0.6 with an absolute neutrophil count of 0.5.  Patient unable to give much history, family in room , he has been deemed not a surgical candidate.   Past Medical History:  Diagnosis Date  . Back pain   . Cancer ([place])   . Cerebral palsy ([place])   . DDD (degenerative disc disease), lumbar   . Depression   . Depression   . GERD (gastroesophageal reflux disease)   . Mild cognitive disorder   . Other intervertebral disc degeneration, lumbar region     Past Surgical History:  Procedure Laterality Date  . TOTAL HIP ARTHROPLASTY       Prior to Admission medications   Medication Sig Start Date End Date Taking? Authorizing Provider  acetaminophen (TYLENOL) 500 MG tablet Take 500 mg by mouth 2 (two) times daily.   Yes [provider]  ARIPiprazole (ABILIFY) 2 MG tablet Take 2 mg by mouth every Monday, Wednesday, and Friday. At bedtime.   Yes [provider]  aspirin EC 81 MG tablet Take 81 mg by mouth daily.   Yes [provider]  bisacodyl (DULCOLAX) 5 MG EC tablet Take 5 mg by mouth at bedtime.   Yes [provider]  Ca Carbonate-Mag Hydroxide (ROLAIDS PO) Take 1 tablet by mouth every 8 (eight) hours as needed. Chew and swallow 1 tablet by mouth every 8 hours as needed for acid reflux.   Yes [provider]  Ca Carbonate-Mag Hydroxide (ROLAIDS) 550-110 MG CHEW Chew 2 tablets by mouth 3 (three) times daily. Chew and swallow 2 tablets before meals.   Yes [provider]  Carboxymethylcellulose Sodium (REFRESH LIQUIGEL) 1 % GEL Place 2 drops into the right eye 3 (three) times daily as needed.   Yes [provider]  cetirizine (ZYRTEC) 10 MG tablet Take 10 mg by mouth daily.   Yes [provider]  Cholecalciferol (D3-1000 PO) Take 1,000 Units by mouth daily.   Yes [provider]  docusate sodium (COLACE) 100 MG capsule Take  200 mg by mouth daily. At 6 AM   Yes [provider]  DULoxetine (CYMBALTA) 60 MG capsule Take 60 mg by mouth daily. At 6 AM   Yes [provider]  fluticasone (FLONASE) 50 MCG/ACT nasal spray Place 1 spray into both nostrils daily.   Yes [provider]  gabapentin (NEURONTIN) 600 MG tablet Take 600 mg by mouth at bedtime.   Yes [provider]  ibuprofen (ADVIL,MOTRIN) 600 MG tablet Take 600 mg by mouth every 6 (six) hours as needed for pain. Or inflammation. 06/08/17  Yes [provider]  levothyroxine (SYNTHROID, LEVOTHROID) 100 MCG tablet Take 100 mcg by mouth daily before  breakfast.   Yes [provider]  Lidocaine 4 % PTCH Apply 1 patch topically daily. Apply to right shoulder at 8 AM, leave on for 12 hours, then remove at 8 PM and leave off for 12 hours.   Yes [provider]  loperamide (IMODIUM A-D) 2 MG tablet Take 2 mg by mouth every 4 (four) hours as needed for diarrhea or loose stools. Do not use for more than 48 hours consecutively. 06/08/17  Yes [provider]  magic mouthwash w/lidocaine SOLN Take 15-30 mLs by mouth 3 (three) times daily. Take 15 - 30 ML by mouth 5-10 minutes before a meal. Use a straw and try to swallow directly without swishing around in mouth.   Yes [provider]  Melatonin 3 MG TABS Take 3 mg by mouth at bedtime. At 8 PM   Yes [provider]  memantine (NAMENDA XR) 28 MG CP24 24 hr capsule Take 28 mg by mouth daily.   Yes [provider]  Menthol (ICY HOT BACK) 5 % PTCH Apply 1 patch topically daily as needed. Remove at bedtime   Yes [provider]  modafinil (PROVIGIL) 200 MG tablet Take 200 mg by mouth daily.    Yes [provider]  morphine (MS CONTIN) 15 MG 12 hr tablet Take 1 tablet (15 mg total) by mouth every 12 (twelve) hours. 11/24/16  Yes Earleen Newport, MD  Multiple Vitamins-Minerals (CERTAVITE SENIOR/ANTIOXIDANT PO) Take 1 tablet by mouth daily. At 6 AM   Yes [provider]  oxycodone (OXY-IR) 5 MG capsule Take 5 mg by mouth every 6 (six) hours. At 08:00, 14:00, and 20:00 for 23 days. 04/19/18 05/11/18 Yes [provider]  pantoprazole (PROTONIX) 40 MG tablet Take 1 tablet (40 mg total) by mouth daily. Patient taking differently: Take 40 mg by mouth 2 (two) times daily.  07/01/17  Yes [name], MD  Polyethyl Glycol-Propyl Glycol (SYSTANE) 0.4-0.3 % GEL ophthalmic gel Place 1 drop into both eyes at bedtime.   Yes [provider]  polyethylene glycol (MIRALAX / GLYCOLAX) packet Take 17 g by mouth every other day.    Yes  [provider]  polyethylene glycol (MIRALAX / GLYCOLAX) packet Take 17 g by mouth every other day as needed (if no bowel movement in 48 hours). Mix 1 capful (17 grams) with 8 oz of fluid every other day as needed.   Yes [provider]  prochlorperazine (COMPAZINE) 10 MG tablet Take 10 mg by mouth every 6 (six) hours as needed for nausea. 03/29/18  Yes [provider]  Propylene Glycol (SYSTANE COMPLETE) 0.6 % SOLN Place 1 drop into both eyes 3 (three) times daily.   Yes [provider]  senna (SENOKOT) 8.6 MG TABS tablet Take 1 tablet by mouth 2 (two) times daily.  Yes [provider]  simvastatin (ZOCOR) 20 MG tablet Take 20 mg by mouth at bedtime.   Yes [provider]  tamsulosin (FLOMAX) 0.4 MG CAPS capsule Take 0.4 mg by mouth daily after supper.   Yes [provider]  traMADol (ULTRAM) 50 MG tablet Take 50 mg by mouth every 8 (eight) hours as needed for pain.   Yes [provider]  trolamine salicylate (ASPERCREME) 10 % cream Apply 1 application topically 2 (two) times daily. To hands and right shoulder. Do not apply under [facility] patch.   Yes [provider]  ondansetron (ZOFRAN-ODT) 8 MG disintegrating tablet Take 8 mg by mouth every 8 (eight) hours as needed for nausea.    [provider]    History reviewed. No pertinent family history.   Social History   Tobacco Use  . Smoking status: Former Research scientist (life sciences)  . Smokeless tobacco: Never Used  Substance Use Topics  . Alcohol use: No  . Drug use: No    Allergies as of 05/01/2018  . (No Known Allergies)    Review of Systems:    Could not obtain    Physical Exam:  Vital signs in last 24 hours: Temp:  [98.3 F (36.8 C)-98.5 F (36.9 C)] 98.5 F (36.9 C) (02/28 0444) Pulse Rate:  [57-65] 59 (02/28 0444) Resp:  [16-20] 20 (02/28 0444) BP: (85-102)/(56-67) 99/64 (02/28 0444) SpO2:  [95 %-100 %] 100 % (02/28 0444) Last BM Date:  05/02/18 General:  Gurgling sounds  Head:  Normocephalic and atraumatic. Eyes:   No icterus.   Conjunctiva pink. PERRLA. Ears:  Normal auditory acuity. Neck:  Supple; no masses or thyroidomegaly Lungs: Respirations even and unlabored. Lungs clear to auscultation bilaterally.   No wheezes, crackles, or rhonchi.  Heart:  Regular rate and rhythm;  Without murmur, clicks, rubs or gallops Abdomen:  Soft, nondistended, nontender. Normal bowel sounds. No appreciable masses or hepatomegaly.  No rebound or guarding.  Neurologic:  Alert and oriented x1;    LAB RESULTS: Recent Labs    05/05/18 0126  WBC 1.1*  HGB 8.7*  HCT 31.4*  PLT 78*   BMET Recent Labs    05/05/18 1715 05/05/18 2139 05/06/18 0115  NA 161* 155* 154*  K  --   --  2.6*  CL  --   --  120*  CO2  --   --  26  GLUCOSE  --   --  167*  BUN  --   --  22  CREATININE  --   --  0.83  CALCIUM  --   --  8.4*   LFT No results for input(s): PROT, ALBUMIN, AST, ALT, ALKPHOS, BILITOT, BILIDIR, IBILI in the last 72 hours. PT/INR No results for input(s): LABPROT, INR in the last 72 hours.  STUDIES: No results found.    Impression / Plan:   Desiderio Dolata is a 73 y.o. y/o male with history of esophageal cancer managed at [facility] admitted with aspiration pneumonia on oxygen.  I have been consulted for placement of a PEG tube.  Reviewing his labs he is still hypernatremic and has a very low neutrophil count in addition there is obstruction at the GE junction with retention of secretions higher up.  Overall this would make him very high risk for the procedure especially at this point of time.  I think we need to wait for this acute infection to improve so that his lungs can tolerate anesthesia in addition would want his neutrophil  count to improve not sure if he has received any agent such as Neulasta but probably should consult oncology for their assistance.    In addition with the evidence of pooling of secretions in the esophagus - my  concern is that his lumen is completely occluded by the cancer. I would suggest to keep NPO for 48 hours , then obtain a barium swallow to check if the lumen is completed occluded , if it has then he has failed his chemotherapy and radiation . At that point goals of care needs to be discussed .   He is probably aspirating as he has his esophageal lumen occluded downstream .     Thank you for involving me in the care of this patient.      LOS: 5 days   Jonathon Bellows, MD  05/06/2018, 9:12 AM

## 2018-05-06 NOTE — Progress Notes (Signed)
Daily Progress Note   Patient Name: Bob [name]       Date: 05/06/2018 DOB: September 30, 1945  Age: 73 y.o. MRN#: [id number] Attending Physician: Saundra Shelling, MD Primary Care Physician: Patient, No Pcp Per Admit Date: 05/01/2018  Reason for Consultation/Follow-up: Establishing goals of care  Subjective: Patient much more awake and alert today. Speech is clearer. Patient's sister, Clarise Cruz arrived from [place] today. Mikki Santee, her husband is also at bedside.  GI consulted for PEG- they note not able to place PEG until [place] increases and respiratory status clears.  Discussed acute and longterm GOC with patient and Mikki Santee and Clarise Cruz. Clarise Cruz attempted ask patient if he wished to continue aggressive medical to treatments vs going back to [facility] with Hospice. Patient answered surface questions- saying he wanted to "take it easy" and "rest"- when asked if he would want a feeding tube or to return to the hospital if he became ill again- he avoided answering and started making irrelevant requests.  Donnalee Curry, and I then met separately.  Prolonged discussion was had regarding patient's likely trajectory. Clarise Cruz understands that patient is not likely to recover functional status beyond where he was prior to admission- additionally, he will not tolerate further chemotherapy. 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 At close of conversation, family requested that patient not receive PEG tube, however, continued to be treated for low sodium and pneumonia through Sunday, at which point they would like patient to be discharged home to [facility] with Hospice care.     Review of Systems  Unable to perform ROS: Acuity of condition     Length of Stay: 5  Current Medications: Scheduled Meds:  . magic mouthwash  5 mL Oral TID  . mouth rinse  15 mL Mouth Rinse q12n4p  . pantoprazole (PROTONIX) IV  40 mg Intravenous Q24H    Continuous Infusions: . sodium chloride 10 mL/hr at 05/06/18 1527  . dextrose 200 mL/hr at 05/06/18 1527  . piperacillin-tazobactam (ZOSYN)  IV 12.5 mL/hr at 05/06/18 1527    PRN Meds: sodium chloride, bisacodyl, ondansetron **OR** ondansetron (ZOFRAN) IV  Physical Exam Vitals signs and nursing note reviewed.  Constitutional:      Comments: cachetic  Cardiovascular:     Rate and  Rhythm: Normal rate.     Pulses: Normal pulses.  Pulmonary:     Effort: Pulmonary effort is normal.  Musculoskeletal:     Comments: BIL hands contracted  Skin:    General: Skin is warm and dry.  Neurological:     Mental Status: He is alert.     Comments: Dysarthric, oriented to person and place  Psychiatric:        Mood and Affect: Mood normal.        Behavior: Behavior normal.        Thought Content: Thought content normal.             Vital Signs: BP 99/64 (BP Location: Left Arm)   Pulse (!) 59   Temp 98.5 F (36.9 C)   Resp 20   Ht '5\' 7"'  (1.702 m)   Wt 75.8 kg   SpO2 100%   BMI 26.16 kg/m  SpO2: SpO2: 100 % O2 Device: O2 Device: Nasal Cannula O2 Flow Rate: O2 Flow Rate (L/min): 5 L/min  Intake/output summary:   Intake/Output Summary (Last 24 hours) at 05/06/2018 1557 Last data filed at 05/06/2018 1527 Gross per 24 hour  Intake 6195.11 ml  Output 1200 ml  Net 4995.11 ml   LBM: Last BM Date: 05/02/18 Baseline Weight: Weight: 75.8 kg Most recent weight: Weight: 75.8 kg       Palliative Assessment/Data: PPS 10%    Flowsheet Rows     Most Recent Value  Intake Tab  Referral Department  Hospitalist  Unit at Time of Referral  Med/Surg Unit  Date Notified  05/04/18  Palliative Care Type  New Palliative care  Reason for referral  Clarify Goals of Care  Date of Admission  05/01/18    # of days IP prior to Palliative referral  3  Clinical Assessment  Psychosocial & Spiritual Assessment  Palliative Care Outcomes      Patient Active Problem List   Diagnosis Date Noted  . Esophageal dysphagia   . Palliative care by specialist   . Advanced care planning/counseling discussion   . Goals of care, counseling/discussion   . Altered mental status   . Protein-calorie malnutrition, severe 05/04/2018  . Severe sepsis ([place]) 05/01/2018  . HCAP (healthcare-associated pneumonia) 05/01/2018  . AKI (acute kidney injury) ([place]) 05/01/2018  . GERD (gastroesophageal reflux disease) 05/01/2018  . Cerebral palsy ([place]) 05/01/2018  . 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 GI bleed 06/30/2017    Palliative Care Assessment & Plan   Patient Profile:  73 y.o. 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 Workup reveals pneumonia, ongoing aspiration, severe hypernatremia. Palliative medicine consulted for [place].    Assessment/Recommendations/Plan   Continue to treat sodium levels and pneumonia until Sunday  Discharge patient home (to [facility]) on Sunday with Hospice support and comfort measures per family request  Patient will need comfort medications- recommend the following:  Transition to full comfort measures 26m morphine concentrate SL q1hr prn SOB, air hunger Lorazepam 152mpo, SL or IV q4 hr anxiety Haldol .35m535mo, SL or IV q4 hr PRN agitation Glycopyrrolate .2mg28m q4hr secretions  No PEG tube  No further chemotherapy  No further rehospitalizations  Goals of Care and Additional Recommendations:  Limitations on Scope of Treatment: Full Scope Treatment until Sunday- then transition to comfort care- patient will need comfort medications  Code Status:  DNR  Prognosis:   < 4 weeks due  to malignancy with progressing obstruction of the gastro esophogeal junction causing recurrent aspiration, now with pneumonia, significantly declining in functional status, not seeking further aggressive medical therapies  Discharge Planning:  Home with Hospice  Care plan was discussed with patient, patient's sister and brother in law, Dr. Estanislado Pandy, and [place], LCSW.  Thank you for allowing the Palliative Medicine Team to assist in the care of this patient.   Time In: 1430 Time Out: 1700 Total Time 150 minutes Prolonged Time Billed Yes      Greater than 50%  of this time was spent counseling and coordinating care related to the above assessment and plan.  Mariana Kaufman, AGNP-C Palliative Medicine   Please contact Palliative Medicine Team phone at [phone] for questions and concerns.

## 2018-05-06 NOTE — Progress Notes (Addendum)
PHARMACY CONSULT NOTE - FOLLOW UP  Pharmacy Consult for Electrolyte Monitoring and Replacement   Recent Labs: Potassium (mmol/L)  Date Value  05/06/2018 3.2 (L)   Magnesium (mg/dL)  Date Value  05/06/2018 1.9   Calcium (mg/dL)  Date Value  05/06/2018 8.7 (L)   Albumin (g/dL)  Date Value  05/01/2018 3.1 (L)   Sodium (mmol/L)  Date Value  05/06/2018 149 (H)     Assessment: Pharmacy consulted for potassium replacement. K+ is 2.6>3.2 after KCl 10 mEq x 6. Lab was drawn before all of the potassium runs were completed. Pt will receive two more bags after lab draw. Predict the potassium level to be > 3.5.   Goal of Therapy:  K+ 3.5 - 5  Plan:  Will order potassium level with AM labs, and reassess.    Oswald Hillock ,PharmD, BCPS Clinical Pharmacist 05/06/2018 12:06 PM

## 2018-05-06 NOTE — Progress Notes (Signed)
Dr Estanislado Pandy aware that pt hasnt had BM in several days, new orders to be placed

## 2018-05-07 LAB — URINALYSIS, COMPLETE (UACMP) WITH MICROSCOPIC
Bacteria, UA: NONE SEEN
Bilirubin Urine: NEGATIVE
Glucose, UA: NEGATIVE mg/dL
Ketones, ur: NEGATIVE mg/dL
Leukocytes,Ua: NEGATIVE
Nitrite: NEGATIVE
Protein, ur: NEGATIVE mg/dL
SPECIFIC GRAVITY, URINE: 1.006 (ref 1.005–1.030)
Squamous Epithelial / HPF: NONE SEEN (ref 0–5)
pH: 8 (ref 5.0–8.0)

## 2018-05-07 LAB — BASIC METABOLIC PANEL
Anion gap: 7 (ref 5–15)
BUN: 16 mg/dL (ref 8–23)
CHLORIDE: 120 mmol/L — AB (ref 98–111)
CO2: 23 mmol/L (ref 22–32)
Calcium: 8.5 mg/dL — ABNORMAL LOW (ref 8.9–10.3)
Creatinine, Ser: 0.98 mg/dL (ref 0.61–1.24)
GFR calc Af Amer: >60 mL/min (ref >60)
GFR calc non Af Amer: >60 mL/min (ref >60)
Glucose, Bld: 92 mg/dL (ref 70–99)
Potassium: 3.6 mmol/L (ref 3.5–5.1)
Sodium: 150 mmol/L — ABNORMAL HIGH (ref 135–145)

## 2018-05-07 LAB — POTASSIUM: Potassium: 3.2 mmol/L — ABNORMAL LOW (ref 3.5–5.1)

## 2018-05-07 LAB — SODIUM
SODIUM: 148 mmol/L — AB (ref 135–145)
SODIUM: 147 mmol/L — AB (ref 135–145)

## 2018-05-07 MED ORDER — POTASSIUM CHLORIDE 2 MEQ/ML IV SOLN
INTRAVENOUS | Status: DC
  Administered 2018-05-07: 07:00:00 via INTRAVENOUS
  Filled 2018-05-07 (×4): qty 1000

## 2018-05-07 MED ORDER — POTASSIUM CHLORIDE IN NACL 40-0.9 MEQ/L-% IV SOLN
INTRAVENOUS | Status: DC
  Filled 2018-05-07: qty 1000

## 2018-05-07 MED ORDER — LORAZEPAM 2 MG/ML IJ SOLN
2.0000 mg | Freq: Four times a day (QID) | INTRAMUSCULAR | Status: DC | PRN
  Administered 2018-05-07 – 2018-05-08 (×2): 2 mg via INTRAVENOUS
  Filled 2018-05-07 (×2): qty 1

## 2018-05-07 MED ORDER — DEXTROSE 5 % IV SOLN
INTRAVENOUS | Status: DC
  Administered 2018-05-07 – 2018-05-09 (×6): via INTRAVENOUS

## 2018-05-07 MED ORDER — IBUPROFEN 400 MG PO TABS: 400.0000 mg | ORAL_TABLET | Freq: Once | ORAL | Status: DC

## 2018-05-07 MED ORDER — POTASSIUM CHLORIDE 10 MEQ/100ML IV SOLN
10.0000 meq | INTRAVENOUS | Status: AC
  Administered 2018-05-07 (×6): 10 meq via INTRAVENOUS
  Filled 2018-05-07 (×4): qty 100

## 2018-05-07 MED ORDER — ACETAMINOPHEN 650 MG RE SUPP
650.0000 mg | RECTAL | Status: DC | PRN
  Administered 2018-05-07 – 2018-05-09 (×2): 650 mg via RECTAL
  Filled 2018-05-07 (×2): qty 1

## 2018-05-07 MED ORDER — ACETAMINOPHEN 10 MG/ML IV SOLN
1000.0000 mg | Freq: Once | INTRAVENOUS | Status: AC
  Administered 2018-05-07: 1000 mg via INTRAVENOUS
  Filled 2018-05-07: qty 100

## 2018-05-07 NOTE — Clinical Social Work Note (Addendum)
CSW contacted [facility] to see if they can accept patient back with hospice services. CSW spoke with Ty a med Tech at facility. She states that she will have her manager call CSW back to discuss case and see if they can accept patient back. CSW will continue to follow for discharge planning.   UPDATE: CSW received phone call from Doctor, general practice at [facility]. She states that they can accept patient back but will have to wait until Monday due to need for oxygen and equipment. They will also come evaluate patient on Monday morning to determine if appropriate.  Patient will need a hospital bed and oxygen at facility and facility has requested [facility] hospice services. CSW spoke with [name] Hiss from [place] who states that he can get everything set up on Monday for patient.  [place], [place]

## 2018-05-07 NOTE — Progress Notes (Signed)
[facility]  ROUNDING NOTE   Subjective:   Family at bedside. Sister  Tmax 101.4  Na 150  5L [place]   Objective:  Vital signs in last 24 hours:  Temp:  [99 F (37.2 C)-101.4 F (38.6 C)] 99.1 F (37.3 C) (02/29 1028) Pulse Rate:  [69-96] 96 (02/29 0809) Resp:  [16-22] 22 (02/29 0431) BP: (105-155)/(62-111) 107/65 (02/29 0809) SpO2:  [91 %-100 %] 94 % (02/29 0431)  Weight change:  Filed Weights   05/01/18 2047  Weight: 75.8 kg    Intake/Output: I/O last 3 completed shifts: In: 6806 [I.V.:5805; IV Piggyback:1001] Out: 3700 [Urine:3700]   Intake/Output this shift:  Total I/O In: 825.8 [I.V.:276.1; IV Piggyback:549.6] Out: -   Physical Exam: General: No acute distress  Head: Normocephalic, atraumatic. Dry oral mucosal membranes  Eyes: Anicteric  Neck: Supple, trachea midline  Lungs:  Bilateral rhonchi/rales, normal effort  Heart: S1S2 no rubs  Abdomen:  Soft, nontender, bowel sounds present  Extremities: ++ peripheral edema.  Neurologic: Awake, alert, following commands  Skin: No lesions        Basic Metabolic Panel: Recent Labs  Lab 05/02/18 0458 05/03/18 0547  05/06/18 0115 05/06/18 0931 05/06/18 1052 05/06/18 1348 05/06/18 1847 05/07/18 0148 05/07/18 0658 05/07/18 1231  NA 155* 167*   < > 154* 149*  --  149* 150* 148* 147* 150*  K 4.5 3.7  --  2.6* 3.2* 3.2*  --   --  3.2*  --  3.6  CL 121* >130*  --  120* 117*  --   --   --   --   --  120*  CO2 25 27  --  26 26  --   --   --   --   --  23  GLUCOSE 88 114*  --  167* 142*  --   --   --   --   --  92  BUN 39* 24*  --  22 19  --   --   --   --   --  16  CREATININE 1.46* 1.02  --  0.83 0.82  --   --   --   --   --  0.98  CALCIUM 8.5* 9.5  --  8.4* 8.7*  --   --   --   --   --  8.5*  MG  --   --   --   --   --  1.9  --   --   --   --   --    < > = values in this interval not displayed.    Liver Function Tests: Recent Labs  Lab 05/01/18 2051  AST 58*  ALT 25  ALKPHOS 55   BILITOT 0.5  PROT 6.9  ALBUMIN 3.1*   No results for input(s): LIPASE, AMYLASE in the last 168 hours. No results for input(s): AMMONIA in the last 168 hours.  CBC: Recent Labs  Lab 05/01/18 2051 05/02/18 0458 05/03/18 0547 05/05/18 0126 05/06/18 1348  WBC 0.6* 0.6*  0.6* 0.7* 1.1* 1.6*  NEUTROABS  --  0.5*  --   --  1.4*  HGB 9.7* 8.0*  8.0* 7.9* 8.7* 7.9*  HCT 33.3* 28.4*  27.7* 28.3* 31.4* 28.1*  MCV 89.3 92.8  90.2 92.2 92.9 91.8  PLT 83* 61*  69* 71* 78* 78*    Cardiac Enzymes: Recent Labs  Lab 05/01/18 2051  TROPONINI 0.07*    BNP: Invalid input(s): POCBNP  CBG: No results for input(s): GLUCAP in the last 168 hours.  Microbiology: Results for orders placed or performed during the hospital encounter of 05/01/18  Blood culture (routine x 2)     Status: None   Collection Time: 05/01/18 10:36 PM  Result Value Ref Range Status   Specimen Description BLOOD LEFT HAND  Final   Special Requests   Final    BOTTLES DRAWN AEROBIC ONLY Blood Culture results may not be optimal due to an inadequate volume of blood received in culture bottles   Culture   Final    NO GROWTH 5 DAYS Performed at [facility], [address] [place], [place] [address]    Report Status 05/06/2018 FINAL  Final  Blood culture (routine x 2)     Status: None   Collection Time: 05/01/18 10:45 PM  Result Value Ref Range Status   Specimen Description BLOOD RIGHT HAND  Final   Special Requests   Final    BOTTLES DRAWN AEROBIC AND ANAEROBIC Blood Culture adequate volume   Culture   Final    NO GROWTH 5 DAYS Performed at [facility], [address] [place], [place] [address]    Report Status 05/06/2018 FINAL  Final  MRSA PCR Screening     Status: None   Collection Time: 05/02/18  5:05 PM  Result Value Ref Range Status   MRSA by PCR NEGATIVE NEGATIVE Final    Comment:        The GeneXpert MRSA Assay (FDA approved for NASAL specimens only), is one component of  a comprehensive MRSA colonization surveillance program. It is not intended to diagnose MRSA infection nor to guide or monitor treatment for MRSA infections. Performed at [facility], [address] [place], [place] [address]     Coagulation Studies: No results for input(s): LABPROT, INR in the last 72 hours.  Urinalysis: Recent Labs    05/07/18 0812  COLORURINE STRAW*  LABSPEC 1.006  PHURINE 8.0  GLUCOSEU NEGATIVE  HGBUR SMALL*  BILIRUBINUR NEGATIVE  KETONESUR NEGATIVE  PROTEINUR NEGATIVE  NITRITE NEGATIVE  LEUKOCYTESUR NEGATIVE      Imaging: No results found.   Medications:   . sodium chloride Stopped (05/07/18 1421)  . dextrose    . piperacillin-tazobactam (ZOSYN)  IV 3.375 g (05/07/18 1421)   . magic mouthwash  5 mL Oral TID  . mouth rinse  15 mL Mouth Rinse q12n4p  . pantoprazole (PROTONIX) IV  40 mg Intravenous Q24H   sodium chloride, acetaminophen, bisacodyl, ondansetron **OR** ondansetron (ZOFRAN) IV  Assessment/ Plan:  73 y.o. male with a PMHx of cerebral palsy, lumbar degenerative disc disease, depression, GERD, esophageal cancer, who was admitted to [facility] on 05/01/2018 for evaluation of shortness of breath and altered mental status.    1.  Hypernatremia. Free water deficit of 2.7 liters.  2.  Acute renal failure  3.  Esophogeal cancer.  4.  Aspiration pneumonia.   Plan:  Patient says he is not interested in aggressive intervention at this time.  - Increase D5W infusion to 177mL - Labs in AM   LOS: 6 Lester Platas 2/29/20203:14 PM

## 2018-05-07 NOTE — Progress Notes (Signed)
Pharmacy Electrolyte Monitoring Consult:  Pharmacy consulted to assist in monitoring and replacing electrolytes in this 73 y.o. male admitted on 05/01/2018 with Shortness of Breath and Altered Mental Status   Labs:  Sodium (mmol/[name])  Date Value  05/07/2018 150 (H)   Potassium (mmol/[name])  Date Value  05/07/2018 3.6   Magnesium (mg/dL)  Date Value  05/06/2018 1.9   Calcium (mg/dL)  Date Value  05/07/2018 8.5 ([name])   Albumin (g/dL)  Date Value  05/01/2018 3.1 ([name])    Assessment/Plan: Patient received potassium 2mEq IV Q1hr x 6 doses this am. Will obtain follow up BMP/Magnesium with am labs.   Pharmacy will continue to monitor and adjust per consult.    Bob [name],Bob [name] 05/07/2018 3:39 PM

## 2018-05-07 NOTE — Progress Notes (Addendum)
[facility] [place] NAME: Bob [name]    MR#:  [id number]  DATE OF BIRTH:  1945-08-18  SUBJECTIVE: Patient unable to talk but awake, alert, has been having high fever since morning, started IV Tylenol.  Gave 1 dose of Motrin this morning but because of thrombocytopenia we  we could not give further doses.  CHIEF COMPLAINT:   Chief Complaint  Patient presents with  . Shortness of Breath  . Altered Mental Status  Seen and evaluated today Has been kept npo to avoid further aspiration   REVIEW OF SYSTEMS:    ROS  CONSTITUTIONAL: No documented fever. Has fatigue, weakness. No weight gain, no weight loss.  EYES: No blurry or double vision.  ENT: No tinnitus. No postnasal drip. No redness of the oropharynx.  RESPIRATORY: Has cough, no wheeze, no hemoptysis. Has dyspnea.  CARDIOVASCULAR: No chest pain. No orthopnea. No palpitations. No syncope.  GASTROINTESTINAL: No nausea, no vomiting or diarrhea. No abdominal pain. No melena or hematochezia.  GENITOURINARY: No dysuria or hematuria.  ENDOCRINE: No polyuria or nocturia. No heat or cold intolerance.  HEMATOLOGY: No anemia. No bruising. No bleeding.  INTEGUMENTARY: No rashes. No lesions.  MUSCULOSKELETAL: No arthritis. No swelling. No gout.  NEUROLOGIC: No numbness, tingling, or ataxia. No seizure-type activity.  PSYCHIATRIC: No anxiety. No insomnia. No ADD.   DRUG ALLERGIES:  No Known Allergies  VITALS:  Blood pressure 107/65, pulse 96, temperature 99.1 F (37.3 C), temperature source Oral, resp. rate (!) 22, height _0  (1.702 m), weight 75.8 kg, SpO2 94 %.  PHYSICAL EXAMINATION:   Physical Exam  GENERAL:  73 y.o.-year-old patient lying in the bed oxygen via nasal cannula at 5 L EYES: Pupils equal, round, reactive to light and accommodation. No scleral icterus. Extraocular muscles intact.  HEENT: Head atraumatic, normocephalic. Oropharynx dry and nasopharynx clear.  NECK:  Supple,  no jugular venous distention. No thyroid enlargement, no tenderness.  LUNGS: Improved breath sounds bilaterally, bilateral decreased rales heard. No use of accessory muscles of respiration.  CARDIOVASCULAR: S1, S2 normal. No murmurs, rubs, or gallops.  ABDOMEN: Soft, nontender, nondistended. Bowel sounds present. No organomegaly or mass.  EXTREMITIES: No cyanosis, clubbing or edema b/l.    NEUROLOGIC: Cranial nerves II through XII are intact. No focal Motor or sensory deficits b/l.   PSYCHIATRIC: The patient is alert and oriented x 2.  SKIN: No obvious rash, lesion, or ulcer.   LABORATORY PANEL:   CBC Recent Labs  Lab 05/06/18 1348  WBC 1.6*  HGB 7.9*  HCT 28.1*  PLT 78*   ------------------------------------------------------------------------------------------------------------------ Chemistries  Recent Labs  Lab 05/01/18 2051  05/06/18 0931 05/06/18 1052  05/07/18 0148 05/07/18 0658  NA 152*   < > 149*  --    < > 148* 147*  K 4.4   < > 3.2* 3.2*  --  3.2*  --   CL 115*   < > 117*  --   --   --   --   CO2 28   < > 26  --   --   --   --   GLUCOSE 116*   < > 142*  --   --   --   --   BUN 40*   < > 19  --   --   --   --   CREATININE 1.89*   < > 0.82  --   --   --   --  CALCIUM 9.4   < > 8.7*  --   --   --   --   MG  --   --   --  1.9  --   --   --   AST 58*  --   --   --   --   --   --   ALT 25  --   --   --   --   --   --   ALKPHOS 55  --   --   --   --   --   --   BILITOT 0.5  --   --   --   --   --   --    < > = values in this interval not displayed.   ------------------------------------------------------------------------------------------------------------------  Cardiac Enzymes Recent Labs  Lab 05/01/18 2051  TROPONINI 0.07*   ------------------------------------------------------------------------------------------------------------------  RADIOLOGY:  No results found.   ASSESSMENT AND PLAN:  73 year old male patient with history of esophageal cancer  on chemotherapy and radiotherapy, GERD, cerebral palsy currently under hospitalist service for shortness of breath  -Sepsis secondary to pneumonia improved Secondary to aspiration On IV Zosyn antibiotic currently As patient is npo, still having high fever.  Continue IV Tylenol.  Fever up to 101.4 Fahrenheit.   -Acute hypernatremia improving Currently sodium 147. Continue D5 water with KCl at 75 mL/h. Serial sodium monitoring Nephrology evaluation and follow up appreciated  -Aspiration pneumonia IV Zosyn antibiotic currently on board Pureed diet on hold Discussed with family GI consult for possible PEG tube placement ANC level has to be greater than 1 for consideration of PEG tube placement Recheck WBC count If ANC less than 1 will start Neupogen 300 mcg as per oncology recommendation  -Pancytopenia secondary to recent chemotherapy Oncology evaluation and neutropenic precautions Follow-up ANC count and consider Neupogen if ANC less than 1  -Esophageal cancer Oncology follow-up appreciated No further chemotherapy recommended Further follow-up with [facility] oncology  -Cerebral palsy Supportive care  -High risk of morbidity and mortality considering advanced cancer  -Palliative care consult  Appreciated [facility] continue care here Palliative care care team met with family, discharge plan will be home with hospice once hypernatremia is corrected.   Spoke with patient sister Judson Roch.  All the records are reviewed and case discussed with Care Management/Social Worker. Management plans discussed with the patient, family and they are in agreement.  CODE STATUS: DNR  DVT Prophylaxis: SCDs  TOTAL TIME TAKING CARE OF THIS PATIENT: 35 minutes.   POSSIBLE D/C IN 2 to 3 DAYS, DEPENDING ON CLINICAL CONDITION.  Epifanio Lesches M.D on 05/07/2018 at 12:04 PM  Between 7am to 6pm - Pager - [phone]  After 6pm go to [email or web address] -  password [email or web address] [facility]  Office  [phone]  CC: Primary care physician; Patient, No Pcp Per  Note: This dictation was prepared with Dragon dictation along with smaller phrase technology. Any transcriptional errors that result from this process are unintentional.

## 2018-05-07 NOTE — Progress Notes (Signed)
Dr Vianne Bulls made aware that pt temp remains high after tylenol supp

## 2018-05-08 LAB — BASIC METABOLIC PANEL
Anion gap: 6 (ref 5–15)
BUN: 15 mg/dL (ref 8–23)
CHLORIDE: 118 mmol/L — AB (ref 98–111)
CO2: 23 mmol/L (ref 22–32)
CREATININE: 0.84 mg/dL (ref 0.61–1.24)
Calcium: 8.4 mg/dL — ABNORMAL LOW (ref 8.9–10.3)
GFR calc Af Amer: >60 mL/min (ref >60)
GFR calc non Af Amer: >60 mL/min (ref >60)
Glucose, Bld: 140 mg/dL — ABNORMAL HIGH (ref 70–99)
Potassium: 3.4 mmol/L — ABNORMAL LOW (ref 3.5–5.1)
Sodium: 147 mmol/L — ABNORMAL HIGH (ref 135–145)

## 2018-05-08 LAB — CBC
HCT: 24.7 % — ABNORMAL LOW (ref 39.0–52.0)
Hemoglobin: 7.2 g/dL — ABNORMAL LOW (ref 13.0–17.0)
MCH: 25.9 pg — ABNORMAL LOW (ref 26.0–34.0)
MCHC: 29.1 g/dL — ABNORMAL LOW (ref 30.0–36.0)
MCV: 88.8 fL (ref 80.0–100.0)
Platelets: 105 10*3/uL — ABNORMAL LOW (ref 150–400)
RBC: 2.78 MIL/uL — ABNORMAL LOW (ref 4.22–5.81)
RDW: 18.8 % — ABNORMAL HIGH (ref 11.5–15.5)
WBC: 2.1 10*3/uL — ABNORMAL LOW (ref 4.0–10.5)
nRBC: 1 % — ABNORMAL HIGH (ref 0.0–0.2)

## 2018-05-08 LAB — MAGNESIUM: Magnesium: 2 mg/dL (ref 1.7–2.4)

## 2018-05-08 MED ORDER — RISPERIDONE 1 MG PO TABS
1.0000 mg | ORAL_TABLET | Freq: Two times a day (BID) | ORAL | Status: DC | PRN
  Filled 2018-05-08: qty 1

## 2018-05-08 MED ORDER — RISPERIDONE 1 MG PO TBDP
1.0000 mg | ORAL_TABLET | Freq: Every day | ORAL | Status: DC
  Administered 2018-05-08: 1 mg via ORAL
  Filled 2018-05-08 (×2): qty 1

## 2018-05-08 MED ORDER — POTASSIUM CHLORIDE 10 MEQ/100ML IV SOLN
10.0000 meq | INTRAVENOUS | Status: AC
  Administered 2018-05-08 (×2): 10 meq via INTRAVENOUS
  Filled 2018-05-08 (×2): qty 100

## 2018-05-08 NOTE — Progress Notes (Signed)
[place] [facility] [place] NAME: Bob [name]    MR#:  [id number]  DATE OF BIRTH:  [birth year]  SUBJECTIVE: No fever, family is at bedside, patient denies any complaints..  CHIEF COMPLAINT:   Chief Complaint  Patient presents with  . Shortness of Breath  . Altered Mental Status  Seen and evaluated today Has been kept npo to avoid further aspiration   REVIEW OF SYSTEMS:    ROS  CONSTITUTIONAL: No documented fever. Has fatigue, weakness. No weight gain, no weight loss.  EYES: No blurry or double vision.  ENT: No tinnitus. No postnasal drip. No redness of the oropharynx.  RESPIRATORY: Has cough, no wheeze, no hemoptysis. Has dyspnea.  CARDIOVASCULAR: No chest pain. No orthopnea. No palpitations. No syncope.  GASTROINTESTINAL: No nausea, no vomiting or diarrhea. No abdominal pain. No melena or hematochezia.  GENITOURINARY: No dysuria or hematuria.  ENDOCRINE: No polyuria or nocturia. No heat or cold intolerance.  HEMATOLOGY: No anemia. No bruising. No bleeding.  INTEGUMENTARY: No rashes. No lesions.  MUSCULOSKELETAL: No arthritis. No swelling. No gout.  NEUROLOGIC: No numbness, tingling, or ataxia. No seizure-type activity.  PSYCHIATRIC: No anxiety. No insomnia. No ADD.   DRUG ALLERGIES:  No Known Allergies  VITALS:  Blood pressure (!) 98/51, pulse 75, temperature 97.8 F (36.6 C), resp. rate 16, height '5\' 7"'  (1.702 m), weight 75.8 kg, SpO2 95 %.  PHYSICAL EXAMINATION:   Physical Exam  GENERAL:  73 y.o.-year-old patient lying in the bed oxygen via nasal cannula at 4 L, nonverbal. EYES: Pupils equal, round, reactive to ligh. No scleral icterus. Extraocular muscles intact.  HEENT: Head atraumatic, normocephalic. Oropharynx dry and nasopharynx clear.  NECK:  Supple, no jugular venous distention. No thyroid enlargement, no tenderness.  LUNGS: Improved breath sounds bilaterally, bilateral decreased rales heard. No use of accessory muscles of  respiration.  CARDIOVASCULAR: S1, S2 normal. No murmurs, rubs, or gallops.  ABDOMEN: Soft, nontender, nondistended. Bowel sounds present. No organomegaly or mass.  EXTREMITIES: No cyanosis, clubbing or edema b/l.    NEUROLOGIC: Cranial nerves II through XII are intact. No focal Motor or sensory deficits b/l.   PSYCHIATRIC: The patient is alert and oriented x 2.  SKIN: No obvious rash, lesion, or ulcer.   LABORATORY PANEL:   CBC Recent Labs  Lab 05/08/18 0502  WBC 2.1*  HGB 7.2*  HCT 24.7*  PLT 105*   ------------------------------------------------------------------------------------------------------------------ Chemistries  Recent Labs  Lab 05/01/18 2051  05/08/18 0502  NA 152*   < > 147*  K 4.4   < > 3.4*  CL 115*   < > 118*  CO2 28   < > 23  GLUCOSE 116*   < > 140*  BUN 40*   < > 15  CREATININE 1.89*   < > 0.84  CALCIUM 9.4   < > 8.4*  MG  --    < > 2.0  AST 58*  --   --   ALT 25  --   --   ALKPHOS 55  --   --   BILITOT 0.5  --   --    < > = values in this interval not displayed.   ------------------------------------------------------------------------------------------------------------------  Cardiac Enzymes Recent Labs  Lab 05/01/18 2051  TROPONINI 0.07*   ------------------------------------------------------------------------------------------------------------------  RADIOLOGY:  No results found.   ASSESSMENT AND PLAN:  73 year old male patient with 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, GERD, cerebral palsy currently under hospitalist service for shortness  of breath  -Sepsis secondary to pneumonia improved Secondary to aspiration On IV Zosyn antibiotic currently, continue full aspiration precautions, family is wondering whether we can arrange for oral suction as needed.it.   -Acute hypernatremia improving Currently sodium 147. Seen by nephrology, increase fluids yesterday.  Patient is eager to go back to [facility]  with hospice either tomorrow or day after.  Discussed with patient's sister, cousin.    -Aspiration pneumonia IV Zosyn antibiotic currently on board At this time patient does not want any invasive procedures, wants to be discharged with hospice when sodium is corrected.   -Pancytopenia secondary to recent chemotherapy Oncology evaluation and neutropenic precautions  -Esophageal cancer Oncology follow-up appreciated No further chemotherapy recommended Further follow-up with [facility] oncology  -Cerebral palsy Supportive care  -High risk of morbidity and mortality considering advanced cancer  -Palliative care consult  Appreciated Patient not [facility] Will continue care here Palliative care care team met with family, discharge plan will be home with hospice once hypernatremia is corrected.   Spoke with patient sister Judson Roch.  Yesterday, spoke with patient's cousin today. All the records are reviewed and case discussed with Care Management/Social Worker. Management plans discussed with the patient, family and they are in agreement.  CODE STATUS: DNR  DVT Prophylaxis: SCDs  TOTAL TIME TAKING CARE OF THIS PATIENT: 35 minutes.   POSSIBLE D/C IN 2 to 3 DAYS, DEPENDING ON CLINICAL CONDITION.  Epifanio Lesches M.D on 05/08/2018 at 12:57 PM  Between 7am to 6pm - Pager - [phone]  After 6pm go to [email or web address] - password [email or web address] [facility]  Office  [phone]  CC: Primary care physician; Patient, No Pcp Per  Note: This dictation was prepared with Dragon dictation along with smaller phrase technology. Any transcriptional errors that result from this process are unintentional.

## 2018-05-08 NOTE — Progress Notes (Signed)
Pharmacy Electrolyte Monitoring Consult:  Pharmacy consulted to assist in monitoring and replacing electrolytes in this 73 y.o. male admitted on 05/01/2018 with Shortness of Breath and Altered Mental Status   Labs:  Sodium (mmol/L)  Date Value  05/08/2018 147 (H)   Potassium (mmol/L)  Date Value  05/08/2018 3.4 (L)   Magnesium (mg/dL)  Date Value  05/08/2018 2.0   Calcium (mg/dL)  Date Value  05/08/2018 8.4 (L)   Albumin (g/dL)  Date Value  05/01/2018 3.1 (L)    Assessment/Plan: K 3.4, pt is NPO, MDs aware and managing Na of 147. Will order potassium 54mEq IV Q1hr x 2 doses. Will obtain follow up BMP with am labs.   Pharmacy will continue to monitor and adjust per consult.    Rocky Morel 05/08/2018 3:40 PM

## 2018-05-08 NOTE — Progress Notes (Signed)
[facility]  ROUNDING NOTE   Subjective:   Sister at bedside.   Na 147  Objective:  Vital signs in last 24 hours:  Temp:  [97.8 F (36.6 C)-98.2 F (36.8 C)] 97.8 F (36.6 C) (03/01 0409) Pulse Rate:  [57-75] 75 (03/01 0409) Resp:  [14-16] 16 (03/01 0409) BP: (87-98)/(51-54) 98/51 (03/01 0409) SpO2:  [91 %-95 %] 95 % (03/01 0409)  Weight change:  Filed Weights   05/01/18 2047  Weight: 75.8 kg    Intake/Output: I/O last 3 completed shifts: In: 3391.2 [I.V.:2549; IV Piggyback:842.2] Out: 1093 [Urine:4225]   Intake/Output this shift:  Total I/O In: -  Out: 500 [Urine:500]  Physical Exam: General: No acute distress  Head: Normocephalic, atraumatic. Dry oral mucosal membranes  Eyes: Anicteric  Neck: Supple, trachea midline  Lungs:  Bilateral rhonchi/rales, normal effort  Heart: S1S2 no rubs  Abdomen:  Soft, nontender, bowel sounds present  Extremities: ++ peripheral edema.  Neurologic: Awake, alert, following commands  Skin: No lesions        Basic Metabolic Panel: Recent Labs  Lab 05/03/18 0547  05/06/18 0115 05/06/18 0931 05/06/18 1052  05/06/18 1847 05/07/18 0148 05/07/18 0658 05/07/18 1231 05/08/18 0502  NA 167*   < > 154* 149*  --    < > 150* 148* 147* 150* 147*  K 3.7  --  2.6* 3.2* 3.2*  --   --  3.2*  --  3.6 3.4*  CL >130*  --  120* 117*  --   --   --   --   --  120* 118*  CO2 27  --  26 26  --   --   --   --   --  23 23  GLUCOSE 114*  --  167* 142*  --   --   --   --   --  92 140*  BUN 24*  --  22 19  --   --   --   --   --  16 15  CREATININE 1.02  --  0.83 0.82  --   --   --   --   --  0.98 0.84  CALCIUM 9.5  --  8.4* 8.7*  --   --   --   --   --  8.5* 8.4*  MG  --   --   --   --  1.9  --   --   --   --   --  2.0   < > = values in this interval not displayed.    Liver Function Tests: Recent Labs  Lab 05/01/18 2051  AST 58*  ALT 25  ALKPHOS 55  BILITOT 0.5  PROT 6.9  ALBUMIN 3.1*   No results for input(s): LIPASE,  AMYLASE in the last 168 hours. No results for input(s): AMMONIA in the last 168 hours.  CBC: Recent Labs  Lab 05/02/18 0458 05/03/18 0547 05/05/18 0126 05/06/18 1348 05/08/18 0502  WBC 0.6*  0.6* 0.7* 1.1* 1.6* 2.1*  NEUTROABS 0.5*  --   --  1.4*  --   HGB 8.0*  8.0* 7.9* 8.7* 7.9* 7.2*  HCT 28.4*  27.7* 28.3* 31.4* 28.1* 24.7*  MCV 92.8  90.2 92.2 92.9 91.8 88.8  PLT 61*  69* 71* 78* 78* 105*    Cardiac Enzymes: Recent Labs  Lab 05/01/18 2051  TROPONINI 0.07*    BNP: Invalid input(s): POCBNP  CBG: No results for input(s): GLUCAP in the last  168 hours.  Microbiology: Results for orders placed or performed during the hospital encounter of 05/01/18  Blood culture (routine x 2)     Status: None   Collection Time: 05/01/18 10:36 PM  Result Value Ref Range Status   Specimen Description BLOOD LEFT HAND  Final   Special Requests   Final    BOTTLES DRAWN AEROBIC ONLY Blood Culture results may not be optimal due to an inadequate volume of blood received in culture bottles   Culture   Final    NO GROWTH 5 DAYS Performed at [facility], [address] [place], [place] [address]    Report Status 05/06/2018 FINAL  Final  Blood culture (routine x 2)     Status: None   Collection Time: 05/01/18 10:45 PM  Result Value Ref Range Status   Specimen Description BLOOD RIGHT HAND  Final   Special Requests   Final    BOTTLES DRAWN AEROBIC AND ANAEROBIC Blood Culture adequate volume   Culture   Final    NO GROWTH 5 DAYS Performed at [facility], [address] [place], [place] [address]    Report Status 05/06/2018 FINAL  Final  MRSA PCR Screening     Status: None   Collection Time: 05/02/18  5:05 PM  Result Value Ref Range Status   MRSA by PCR NEGATIVE NEGATIVE Final    Comment:        The GeneXpert MRSA Assay (FDA approved for NASAL specimens only), is one component of a comprehensive MRSA colonization surveillance program. It is not intended to  diagnose MRSA infection nor to guide or monitor treatment for MRSA infections. Performed at [facility], [address] [place], [place] [address]   CULTURE, BLOOD (ROUTINE X 2) w Reflex to ID Panel     Status: None (Preliminary result)   Collection Time: 05/07/18  5:56 AM  Result Value Ref Range Status   Specimen Description BLOOD BLOOD LEFT HAND  Final   Special Requests   Final    BOTTLES DRAWN AEROBIC AND ANAEROBIC Blood Culture results may not be optimal due to an inadequate volume of blood received in culture bottles   Culture   Final    NO GROWTH 1 DAY Performed at [facility], [address] [place], [place] [address]    Report Status PENDING  Incomplete  CULTURE, BLOOD (ROUTINE X 2) w Reflex to ID Panel     Status: None (Preliminary result)   Collection Time: 05/07/18  6:04 AM  Result Value Ref Range Status   Specimen Description BLOOD BLOOD RIGHT HAND  Final   Special Requests   Final    BOTTLES DRAWN AEROBIC AND ANAEROBIC Blood Culture results may not be optimal due to an inadequate volume of blood received in culture bottles   Culture   Final    NO GROWTH 1 DAY Performed at [facility], [address] [place], [place] [address]    Report Status PENDING  Incomplete    Coagulation Studies: No results for input(s): LABPROT, INR in the last 72 hours.  Urinalysis: Recent Labs    05/07/18 0812  COLORURINE STRAW*  LABSPEC 1.006  PHURINE 8.0  GLUCOSEU NEGATIVE  HGBUR SMALL*  BILIRUBINUR NEGATIVE  KETONESUR NEGATIVE  PROTEINUR NEGATIVE  NITRITE NEGATIVE  LEUKOCYTESUR NEGATIVE      Imaging: No results found.   Medications:   . sodium chloride 250 mL (05/08/18 1339)  . dextrose 150 mL/hr at 05/08/18 1338  .  piperacillin-tazobactam (ZOSYN)  IV 3.375 g (05/08/18 1340)   . magic mouthwash  5 mL Oral TID  . mouth rinse  15 mL Mouth Rinse q12n4p  . pantoprazole (PROTONIX) IV  40 mg Intravenous Q24H   sodium chloride,  acetaminophen, bisacodyl, LORazepam, ondansetron **OR** ondansetron (ZOFRAN) IV  Assessment/ Plan:  73 y.o. male with a PMHx of cerebral palsy, lumbar degenerative disc disease, depression, GERD, esophageal cancer, who was admitted to [facility] on 05/01/2018 for evaluation of shortness of breath and altered mental status.    1.  Hypernatremia. Free water deficit of 1 liters.  2.  Acute renal failure  3.  Esophogeal cancer.  4.  Aspiration pneumonia.   Plan:  Patient says he is not interested in aggressive intervention at this time.  - D5W infusion at 121mL/hr   LOS: 7 Bradley Bostelman 3/1/20203:31 PM

## 2018-05-09 LAB — BASIC METABOLIC PANEL
Anion gap: 8 (ref 5–15)
BUN: 9 mg/dL (ref 8–23)
CO2: 23 mmol/L (ref 22–32)
Calcium: 8.6 mg/dL — ABNORMAL LOW (ref 8.9–10.3)
Chloride: 116 mmol/L — ABNORMAL HIGH (ref 98–111)
Creatinine, Ser: 0.8 mg/dL (ref 0.61–1.24)
GFR calc non Af Amer: >60 mL/min (ref >60)
Glucose, Bld: 139 mg/dL — ABNORMAL HIGH (ref 70–99)
Potassium: 3.3 mmol/L — ABNORMAL LOW (ref 3.5–5.1)
SODIUM: 147 mmol/L — AB (ref 135–145)

## 2018-05-09 MED ORDER — GLYCOPYRROLATE 1 MG PO TABS
1.0000 mg | ORAL_TABLET | Freq: Two times a day (BID) | ORAL | Status: DC
  Filled 2018-05-09 (×2): qty 1

## 2018-05-09 MED ORDER — POTASSIUM CHLORIDE 10 MEQ/100ML IV SOLN
10.0000 meq | INTRAVENOUS | Status: DC
  Filled 2018-05-09: qty 100

## 2018-05-09 MED ORDER — MORPHINE SULFATE (CONCENTRATE) 10 MG/0.5ML PO SOLN: 10.0000 mg | ORAL | Status: DC | PRN

## 2018-05-09 MED ORDER — LORAZEPAM 2 MG/ML PO CONC: 0.5000 mg | ORAL | Status: DC | PRN

## 2018-05-09 MED ORDER — GLYCOPYRROLATE 1 MG PO TABS: 1.0000 mg | ORAL_TABLET | Freq: Two times a day (BID) | ORAL | 0 refills | Status: AC

## 2018-05-09 MED ORDER — MORPHINE SULFATE (CONCENTRATE) 10 MG/0.5ML PO SOLN: 10.0000 mg | ORAL | 0 refills | Status: AC | PRN

## 2018-05-09 MED ORDER — LORAZEPAM 2 MG/ML PO CONC: 0.6000 mg | ORAL | 0 refills | Status: AC | PRN

## 2018-05-09 NOTE — Discharge Summary (Signed)
Bob [name], is a 73 y.o. male  DOB 1945/12/18  MRN [id number].  Admission date:  05/01/2018  Admitting Physician  Lance Coon, MD  Discharge Date:  05/09/2018   Primary MD  Patient, No Pcp Per  Recommendations for primary care physician for things to follow:   Discharge to  [facility] with hospice   Admission Diagnosis  AMS    Discharge Diagnosis  AMS    Principal Problem:   Severe sepsis [facility]) Active Problems:   HCAP (healthcare-associated pneumonia)   AKI (acute kidney injury) ([place])   GERD (gastroesophageal reflux disease)   Cerebral palsy ([place])   Esophageal cancer ([place])   Protein-calorie malnutrition, severe   Esophageal dysphagia   Palliative care by specialist   Advanced care planning/counseling discussion   Goals of care, counseling/discussion   Altered mental status      Past Medical History:  Diagnosis Date  . Back pain   . Cancer ([place])   . Cerebral palsy ([place])   . DDD (degenerative disc disease), lumbar   . Depression   . Depression   . GERD (gastroesophageal reflux disease)   . Mild cognitive disorder   . Other intervertebral disc degeneration, lumbar region     Past Surgical History:  Procedure Laterality Date  . TOTAL HIP ARTHROPLASTY         History of present illness and  Hospital Course:     Kindly see H&P for history of present illness and admission details, please review complete Labs, Consult reports and Test reports for all details in brief  HPI  from the history and physical done on the day of admission 73 year old male patient with history of cerebral palsy, esophageal cancer admitted for shortness of breath, severe sepsis due to pneumonia, acute kidney injury.   Hospital Course  #1 .severe sepsis present on admission secondary to pneumonia, resolving, patient  received IV hydration, IV Vanco and Zosyn, vancomycin stopped after MRSA screen is negative.  Received IV Zosyn while in the hospital.  Because of his advanced esophageal cancer dysphagia patient chose to go back to his [facility] with hospice.  Focus now is on only comfort with morphine, Ativan.  a#2 aspiration pneumonia, maintained on full aspiration precautions. 3.  Hypoernatremia improved with D5 water while in the hospital, seen by nephrologist. History of esophageal cancer, that is post radiation, chemotherapy, seen by Dr. Randa Evens, patient has GE junction adenocarcinoma getting care at [facility], patient had pancytopenia, chemotherapy was on hold because of ongoing pancytopenia cannot eat and also cannot talk, patient sister, patient shows to have comfort measures with hospice and does not want any aggressive intervention.  Today he will be discharged back to [facility] with hospice to follow-up from [facility].  Can continue morphine, Ativan as per discharge instructions.  Discussed with patient sister and patient at length,   Discharge Condition:    Follow UP      Discharge Instructions  and  Discharge Medications      Allergies as of 05/09/2018   No Known Allergies     Medication List    STOP taking these medications   acetaminophen 500 MG tablet Commonly known as:  TYLENOL   ARIPiprazole 2 MG tablet Commonly known as:  ABILIFY   aspirin EC 81 MG tablet   bisacodyl 5 MG EC tablet Commonly known as:  DULCOLAX   CERTAVITE SENIOR/ANTIOXIDANT PO   cetirizine 10 MG tablet Commonly known as:  ZYRTEC   D3-1000  PO   docusate sodium 100 MG capsule Commonly known as:  COLACE   DULoxetine 60 MG capsule Commonly known as:  CYMBALTA   fluticasone 50 MCG/ACT nasal spray Commonly known as:  FLONASE   gabapentin 600 MG tablet Commonly known as:  NEURONTIN   ibuprofen 600 MG tablet Commonly known as:  ADVIL,MOTRIN   ICY HOT BACK 5 %  Ptch Generic drug:  Menthol   IMODIUM A-D 2 MG tablet Generic drug:  loperamide   levothyroxine 100 MCG tablet Commonly known as:  SYNTHROID, LEVOTHROID   Lidocaine 4 % Ptch   magic mouthwash w/lidocaine Soln   Melatonin 3 MG Tabs   memantine 28 MG Cp24 24 hr capsule Commonly known as:  NAMENDA XR   modafinil 200 MG tablet Commonly known as:  PROVIGIL   morphine 15 MG 12 hr tablet Commonly known as:  MS CONTIN Replaced by:  morphine CONCENTRATE 10 MG/0.5ML Soln concentrated solution   ondansetron 8 MG disintegrating tablet Commonly known as:  ZOFRAN-ODT   oxycodone 5 MG capsule Commonly known as:  OXY-IR   pantoprazole 40 MG tablet Commonly known as:  PROTONIX   polyethylene glycol packet Commonly known as:  MIRALAX / GLYCOLAX   prochlorperazine 10 MG tablet Commonly known as:  COMPAZINE   REFRESH LIQUIGEL 1 % Gel Generic drug:  Carboxymethylcellulose Sodium   ROLAIDS 550-110 MG Chew Generic drug:  Ca Carbonate-Mag Hydroxide   ROLAIDS PO   senna 8.6 MG Tabs tablet Commonly known as:  SENOKOT   simvastatin 20 MG tablet Commonly known as:  ZOCOR   SYSTANE 0.4-0.3 % Gel ophthalmic gel Generic drug:  Polyethyl Glycol-Propyl Glycol   SYSTANE COMPLETE 0.6 % Soln Generic drug:  Propylene Glycol   tamsulosin 0.4 MG Caps capsule Commonly known as:  FLOMAX   traMADol 50 MG tablet Commonly known as:  ULTRAM   trolamine salicylate 10 % cream Commonly known as:  ASPERCREME     TAKE these medications   glycopyrrolate 1 MG tablet Commonly known as:  ROBINUL Take 1 tablet (1 mg total) by mouth 2 (two) times daily.   LORazepam 2 MG/ML concentrated solution Commonly known as:  ATIVAN Take 0.3 mLs (0.6 mg total) by mouth every 4 (four) hours as needed for anxiety or sedation.   morphine CONCENTRATE 10 MG/0.5ML Soln concentrated solution Place 0.5 mLs (10 mg total) under the tongue every 4 (four) hours as needed for moderate pain, severe pain or  anxiety. Replaces:  morphine 15 MG 12 hr tablet         Diet and Activity recommendation: See Discharge Instructions above   Consults obtained -oncology, palliative care, nephrology.   Major procedures and Radiology Reports - PLEASE review detailed and final reports for all details, in brief -   Ct Head Wo Contrast  Result Date: 05/01/2018 CLINICAL DATA:  Syncopal episode.  Altered mental status EXAM: CT HEAD WITHOUT CONTRAST TECHNIQUE: Contiguous axial images were obtained from the base of the skull through the vertex without intravenous contrast. COMPARISON:  06/20/2017 FINDINGS: Brain: No acute intracranial abnormality. Specifically, no hemorrhage, hydrocephalus, mass lesion, acute infarction, or significant intracranial injury. Vascular: No hyperdense vessel or unexpected calcification. Skull: No acute calvarial abnormality. Sinuses/Orbits: Visualized paranasal sinuses and mastoids clear. Orbital soft tissues unremarkable. Other: None IMPRESSION: No acute intracranial abnormality. Electronically Signed   By: Rolm Baptise M.D.   On: 05/01/2018 21:57   Ct Angio Chest Pe W And/or Wo Contrast  Result Date: 05/01/2018 CLINICAL DATA:  Weakness  and hypoxia. EXAM: CT ANGIOGRAPHY CHEST WITH CONTRAST TECHNIQUE: Multidetector CT imaging of the chest was performed using the standard protocol during bolus administration of intravenous contrast. Multiplanar CT image reconstructions and MIPs were obtained to evaluate the vascular anatomy. CONTRAST:  58mL OMNIPAQUE IOHEXOL 350 MG/ML SOLN COMPARISON:  None. FINDINGS: Cardiovascular: Good opacification of the central and segmental pulmonary arteries. No focal filling defects. No evidence of significant pulmonary embolus. Normal heart size. No pericardial effusion. Normal caliber thoracic aorta. No aortic dissection. Great vessel origins are patent. Mediastinum/Nodes: The esophagus is diffusely dilated and filled with fluid and ingested material. Dilatation  extends to the esophagogastric junction. Differential diagnosis would include achalasia, distal stricture, or distal esophageal carcinoma. Mildly prominent lymph nodes noted at the EG junction may favor tumor. Consider upper endoscopy for further evaluation. Lungs/Pleura: Patchy infiltration and consolidation in the lung bases may represent aspiration or pneumonia. No pleural effusions. No pneumothorax. Airways are patent. Upper Abdomen: No acute abnormalities identified. Musculoskeletal: Severe degenerative changes in both shoulders with bilateral shoulder effusions, greater on the left. Degenerative changes in the spine. Review of the MIP images confirms the above findings. IMPRESSION: 1. No evidence of significant pulmonary embolus. 2. Dilated fluid-filled esophagus extending to the esophagogastric junction. Differential diagnosis would include achalasia, distal stricture, or distal esophageal carcinoma. Mild prominence of lymph nodes in the EG junction may favor tumor. Consider upper endoscopy for further evaluation. 3. Patchy infiltration and consolidation in the lung bases may represent aspiration or pneumonia. 4. Severe degenerative changes in both shoulders with bilateral shoulder effusions, greater on the left. Electronically Signed   By: Lucienne Capers M.D.   On: 05/01/2018 23:48   Dg Chest Portable 1 View  Result Date: 05/01/2018 CLINICAL DATA:  Syncope EXAM: PORTABLE CHEST 1 VIEW COMPARISON:  01/02/2018 FINDINGS: 2110 hours. Low volumes. Cardiopericardial silhouette is at upper limits of normal for size. Bibasilar atelectasis or infiltrate noted. The visualized bony structures of the thorax are intact. Degenerative changes evident in both shoulders. Telemetry leads overlie the chest. IMPRESSION: Low volume film with bibasilar atelectasis or infiltrate. Electronically Signed   By: Misty Stanley M.D.   On: 05/01/2018 21:43    Micro Results     Recent Results (from the past 240 hour(s))  Blood  culture (routine x 2)     Status: None   Collection Time: 05/01/18 10:36 PM  Result Value Ref Range Status   Specimen Description BLOOD LEFT HAND  Final   Special Requests   Final    BOTTLES DRAWN AEROBIC ONLY Blood Culture results may not be optimal due to an inadequate volume of blood received in culture bottles   Culture   Final    NO GROWTH 5 DAYS Performed at [facility], [address], [place], [place] [address]    Report Status 05/06/2018 FINAL  Final  Blood culture (routine x 2)     Status: None   Collection Time: 05/01/18 10:45 PM  Result Value Ref Range Status   Specimen Description BLOOD RIGHT HAND  Final   Special Requests   Final    BOTTLES DRAWN AEROBIC AND ANAEROBIC Blood Culture adequate volume   Culture   Final    NO GROWTH 5 DAYS Performed at [facility], [address] [place], [place] [address]    Report Status 05/06/2018 FINAL  Final  MRSA PCR Screening     Status: None   Collection Time: 05/02/18  5:05 PM  Result Value Ref Range Status  MRSA by PCR NEGATIVE NEGATIVE Final    Comment:        The GeneXpert MRSA Assay (FDA approved for NASAL specimens only), is one component of a comprehensive MRSA colonization surveillance program. It is not intended to diagnose MRSA infection nor to guide or monitor treatment for MRSA infections. Performed at [facility], [address] [place], [place] [address]   CULTURE, BLOOD (ROUTINE X 2) w Reflex to ID Panel     Status: None (Preliminary result)   Collection Time: 05/07/18  5:56 AM  Result Value Ref Range Status   Specimen Description BLOOD BLOOD LEFT HAND  Final   Special Requests   Final    BOTTLES DRAWN AEROBIC AND ANAEROBIC Blood Culture results may not be optimal due to an inadequate volume of blood received in culture bottles   Culture   Final    NO GROWTH 1 DAY Performed at [facility], [address], [place], [place] [address]    Report Status PENDING   Incomplete  CULTURE, BLOOD (ROUTINE X 2) w Reflex to ID Panel     Status: None (Preliminary result)   Collection Time: 05/07/18  6:04 AM  Result Value Ref Range Status   Specimen Description BLOOD BLOOD RIGHT HAND  Final   Special Requests   Final    BOTTLES DRAWN AEROBIC AND ANAEROBIC Blood Culture results may not be optimal due to an inadequate volume of blood received in culture bottles   Culture   Final    NO GROWTH 1 DAY Performed at [facility], [address], [place], [place] [address]    Report Status PENDING  Incomplete       Today   Subjective:   Bob [name] today stable for discharge.  Objective:   Blood pressure 97/63, pulse 90, temperature 99.7 F (37.6 C), temperature source Axillary, resp. rate 19, height 5\' 7"  (1.702 m), weight 75.8 kg, SpO2 98 %.   Intake/Output Summary (Last 24 hours) at 05/09/2018 0928 Last data filed at 05/09/2018 0529 Gross per 24 hour  Intake 3640.97 ml  Output 2100 ml  Net 1540.97 ml    Exam Awake Alert, Oriented x 3, No new F.N deficits, Normal affect [place].AT,PERRAL, patient has dysphagia, trouble getting words out due to cancer.  Supple Neck,No JVD, No cervical lymphadenopathy appriciated.  Symmetrical Chest wall movement, Good air movement bilaterally, CTAB RRR,No Gallops,Rubs or new Murmurs, No Parasternal Heave +ve B.Sounds, Abd Soft, Non tender, No organomegaly appriciated, No rebound -guarding or rigidity. No Cyanosis, Clubbing or edema, No new Rash or bruise  Data Review   CBC w Diff:  Lab Results  Component Value Date   WBC 2.1 (L) 05/08/2018   HGB 7.2 (L) 05/08/2018   HCT 24.7 (L) 05/08/2018   PLT 105 (L) 05/08/2018   LYMPHOPCT 7 05/06/2018   MONOPCT 5 05/06/2018   EOSPCT 2 05/06/2018   BASOPCT 1 05/06/2018    CMP:  Lab Results  Component Value Date   NA 147 (H) 05/09/2018   K 3.3 (L) 05/09/2018   CL 116 (H) 05/09/2018   CO2 23 05/09/2018   BUN 9 05/09/2018   CREATININE 0.80 05/09/2018   PROT  6.9 05/01/2018   ALBUMIN 3.1 (L) 05/01/2018   BILITOT 0.5 05/01/2018   ALKPHOS 55 05/01/2018   AST 58 (H) 05/01/2018   ALT 25 05/01/2018  .   Total Time in preparing paper work, data evaluation and todays exam - 35 minutes  Rosemaria Inabinet [place]  M.D on 05/09/2018 at 9:28 AM    Note: This dictation was prepared with Dragon dictation along with smaller phrase technology. Any transcriptional errors that result from this process are unintentional.

## 2018-05-09 NOTE — Care Management Important Message (Signed)
Important Message  Patient Details  Name: Bob [name] MRN: [id number] Date of Birth: 17-Feb-1946   Medicare Important Message Given:  Yes    Juliann Pulse A Carsyn Taubman 05/09/2018, 10:32 AM

## 2018-05-09 NOTE — Clinical Social Work Note (Signed)
Patient will discharge back to [facility] today with hospice services. Family at bedside and aware of discharge today. Patient will be transported by EMS. RN to call report and call for transport.   [place], [place]

## 2018-05-09 NOTE — Progress Notes (Signed)
Pharmacy Electrolyte Monitoring Consult:  Pharmacy consulted to assist in monitoring and replacing electrolytes in this 73 y.o. male admitted on 05/01/2018 with Shortness of Breath and Altered Mental Status   Labs:  Sodium (mmol/L)  Date Value  05/09/2018 147 (H)   Potassium (mmol/L)  Date Value  05/09/2018 3.3 (L)   Magnesium (mg/dL)  Date Value  05/08/2018 2.0   Calcium (mg/dL)  Date Value  05/09/2018 8.6 (L)   Albumin (g/dL)  Date Value  05/01/2018 3.1 (L)    Assessment/Plan: K 3.3,Scr 0.80  (Mag on 3/1 = 2.0)  pt is NPO, MDs aware and managing Na of 147. Will order potassium 3mEq IV Q1hr x 3 doses. Will obtain follow up BMP with am labs.   Pharmacy will continue to monitor and adjust per consult.    Tatumn Corbridge A 05/09/2018 7:27 AM

## 2018-05-09 NOTE — Plan of Care (Signed)
Pt is being d/ced to [facility] to be followed by Hospice.  Pt has been alert but not verbal today.  Called report to Med Trude [facility]ney and pt is going to Rm A15.  Pt unable to eat or drink anything and family chose not to have PEG tube placed. Pt has gastric outlet obstruction and esophageal ca causing esophageal filling and aspiration. Did mouth care and pt indicated he's not in pain. EMS called for transport.

## 2018-05-09 NOTE — Clinical Social Work Note (Signed)
Patient was reviewed by [facility] staff this morning. Per Claiborne Billings from [facility], patient can return with [facility] hospice. CSW will facilitate discharge for patient to return to [facility].  [place], [place]

## 2018-05-12 LAB — CULTURE, BLOOD (ROUTINE X 2)
Culture: NO GROWTH
Culture: NO GROWTH

## 2018-06-08 DEATH — deceased

## 2019-07-08 IMAGING — DX DG CHEST 1V PORT
1 series · 1 of 1 positions shown · non-contrast
Comparison: 01/02/2018

CLINICAL DATA: Syncope

EXAM:
PORTABLE CHEST 1 VIEW

[chest ap]
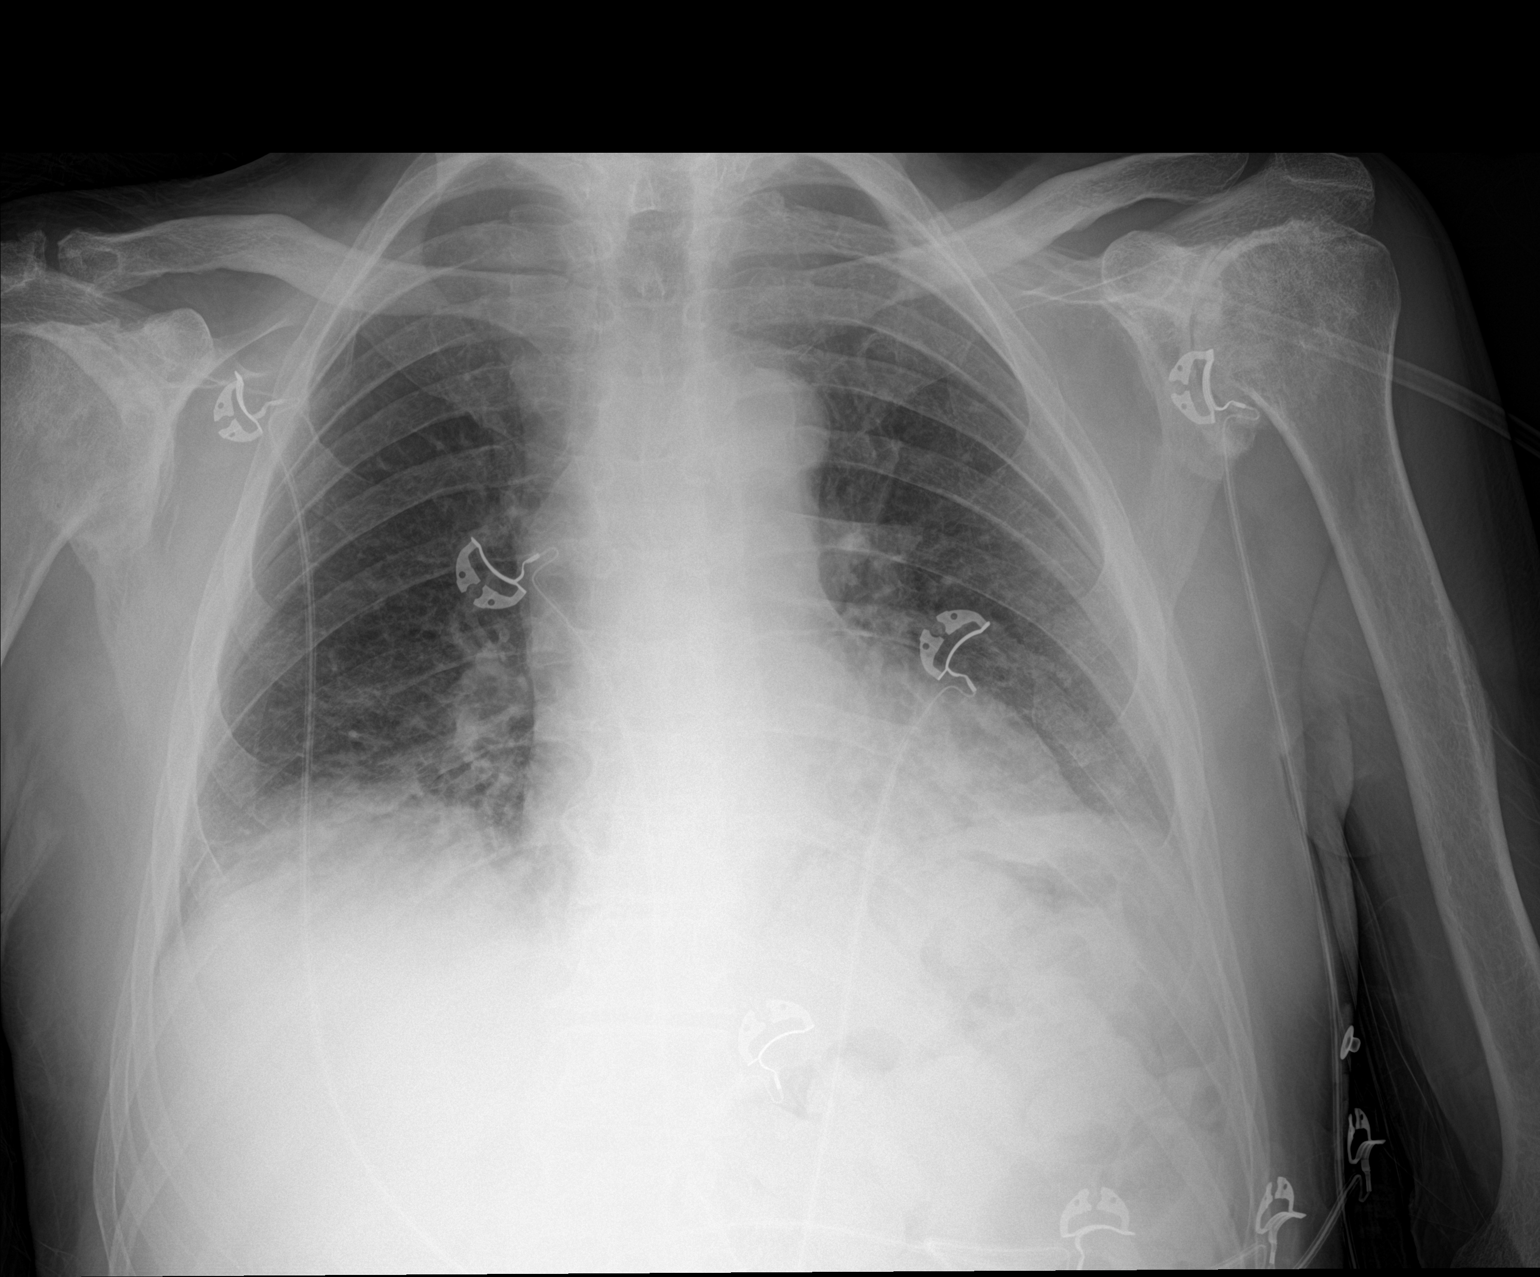

[1 of 1 positions shown; findings below may reference images not displayed]

FINDINGS: 6445 hours. Low volumes. Cardiopericardial silhouette is at upper
limits of normal for size. Bibasilar atelectasis or infiltrate
noted. The visualized bony structures of the thorax are intact.
Degenerative changes evident in both shoulders. Telemetry leads
overlie the chest.
IMPRESSION: Low volume film with bibasilar atelectasis or infiltrate.
